# Patient Record
Sex: Female | Born: 1995 | Race: White | Hispanic: No | Marital: Single | Smoking: Never smoker
Health system: Southern US, Community
[De-identification: ages and names within clinical notes are randomized; demographics above are authoritative.]

## PROBLEM LIST (undated history)

## (undated) DIAGNOSIS — E282 Polycystic ovarian syndrome: Secondary | ICD-10-CM

## (undated) HISTORY — PX: MOUTH SURGERY: SHX715

## (undated) HISTORY — DX: Polycystic ovarian syndrome: E28.2

---
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
## 2013-05-03 ENCOUNTER — Encounter: Payer: Self-pay | Admitting: Internal Medicine

## 2013-05-03 ENCOUNTER — Ambulatory Visit (INDEPENDENT_AMBULATORY_CARE_PROVIDER_SITE_OTHER): Payer: 59 | Admitting: Internal Medicine

## 2013-05-03 VITALS — BP 120/68 | Temp 98.8°F | Ht 70.25 in | Wt 278.0 lb

## 2013-05-03 DIAGNOSIS — L709 Acne, unspecified: Secondary | ICD-10-CM | POA: Insufficient documentation

## 2013-05-03 DIAGNOSIS — E669 Obesity, unspecified: Secondary | ICD-10-CM

## 2013-05-03 DIAGNOSIS — L708 Other acne: Secondary | ICD-10-CM

## 2013-05-03 DIAGNOSIS — E282 Polycystic ovarian syndrome: Secondary | ICD-10-CM

## 2013-05-03 LAB — CBC WITH DIFFERENTIAL/PLATELET
Basophils Relative: 0 % (ref 0–1)
HCT: 36.9 % (ref 36.0–46.0)
Hemoglobin: 12.1 g/dL (ref 12.0–15.0)
Lymphocytes Relative: 27 % (ref 12–46)
Lymphs Abs: 1.8 10*3/uL (ref 0.7–4.0)
MCHC: 32.8 g/dL (ref 30.0–36.0)
Monocytes Absolute: 0.4 10*3/uL (ref 0.1–1.0)
Monocytes Relative: 6 % (ref 3–12)
Neutro Abs: 4.3 10*3/uL (ref 1.7–7.7)
Neutrophils Relative %: 64 % (ref 43–77)
RBC: 4.58 MIL/uL (ref 3.87–5.11)

## 2013-05-03 LAB — HEMOGLOBIN A1C
Hgb A1c MFr Bld: 5.4 % (ref <5.7)
Mean Plasma Glucose: 108 mg/dL (ref <117)

## 2013-05-03 LAB — CHOLESTEROL, TOTAL: Cholesterol: 151 mg/dL (ref 0–169)

## 2013-05-03 LAB — TSH: TSH: 1.264 u[IU]/mL (ref 0.350–4.500)

## 2013-05-03 NOTE — Progress Notes (Signed)
  Subjective:    Patient ID: Bethany Boyd, female    DOB: 01-04-1996, 18 y.o.   MRN: [id number]  HPI  17 year old White female will be attending [employer] and taking engineering courses at [employer] this fall. 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 She's here for college entrance exam. Has appointment see Dr. Shawnie Pons in a couple of weeks regarding polycystic ovary syndrome. Patient has irregular menses, acne, hirsutism. She is overweight. Says she gets some exercise. She's been working this summer with her father but not really watching her diet. Both parents are overweight. Sister is overweight.  No complaints or problems today.  Did have Gardasil series through Dr. Nicholas Lose, GYN.  Not sexually active. Does not smoke,consume alcohol, or use illicit drugs  Review of immunization records indicates only one MMR immunization. Has never had meningitis vaccine.  Family history: Father with hypertension. Mother with history of diabetes mellitus and asthma  Social history: Parents are married. Both she and her sister are very good students. No other siblings.     Review of Systems  Constitutional: Negative.   Endocrine:       Hirsutism  Genitourinary:       Irregular menses  Skin:       Acne  All other systems reviewed and are negative.       Objective:   Physical Exam  Vitals reviewed. Constitutional: She is oriented to person, place, and time. She appears well-developed and well-nourished. No distress.  Obese  HENT:  Head: Normocephalic and atraumatic.  Right Ear: External ear normal.  Left Ear: External ear normal.  Mouth/Throat: Oropharynx is clear and moist. No oropharyngeal exudate.  Eyes: Conjunctivae and EOM are normal. Pupils are equal, round, and reactive to light. Right eye exhibits no discharge. Left eye exhibits no discharge. No scleral icterus.  Neck: Neck supple. No JVD present. No thyromegaly present.  Cardiovascular: Normal rate,  regular rhythm and normal heart sounds.   No murmur heard. Pulmonary/Chest: Effort normal and breath sounds normal. No respiratory distress. She has no wheezes. She has no rales. She exhibits no tenderness.  Breasts normal female without masses  Abdominal: Soft. Bowel sounds are normal. She exhibits no distension and no mass. There is no tenderness. There is no rebound and no guarding.  Genitourinary:  deferred  Lymphadenopathy:    She has no cervical adenopathy.  Neurological: She is alert and oriented to person, place, and time. She has normal reflexes. She displays normal reflexes. No cranial nerve deficit. Coordination normal.  Skin: Skin is warm and dry. She is not diaphoretic.  Acne on face and upper back  Hisutism face, neck, round nipples and abdomen  Psychiatric: She has a normal mood and affect. Her behavior is normal. Judgment and thought content normal.          Assessment & Plan:  History of polycystic ovary syndrome diagnosed by Dr. Nicholas Lose. Patient has irregular menses, acne, hirsutism, obesity  Plan: Check TSH and hemoglobin A1c. Patient is to see Dr. Shawnie Pons in a couple of weeks regarding irregular menses and polycystic ovary syndrome. Spoke with patient about diet and exercise particularly as she is going off to college. We will order MMR and meningitis vaccine. There is a form be completed for college entrance.

## 2013-05-06 NOTE — Patient Instructions (Addendum)
Watch diet and exercise. Followup with GYN physician. Return in one year. Meningitis vaccine has been ordered for college entrance.

## 2013-05-15 ENCOUNTER — Encounter: Payer: 59 | Admitting: Family Medicine

## 2013-05-15 ENCOUNTER — Encounter: Payer: Self-pay | Admitting: Family Medicine

## 2013-05-15 ENCOUNTER — Ambulatory Visit (INDEPENDENT_AMBULATORY_CARE_PROVIDER_SITE_OTHER): Payer: 59 | Admitting: Family Medicine

## 2013-05-15 VITALS — BP 127/84 | HR 85 | Temp 97.0°F | Ht 71.0 in | Wt 293.0 lb

## 2013-05-15 DIAGNOSIS — E282 Polycystic ovarian syndrome: Secondary | ICD-10-CM

## 2013-05-15 MED ORDER — DROSPIREN-ETH ESTRAD-LEVOMEFOL 3-0.03-0.451 MG PO TABS: 1.0000 | ORAL_TABLET | Freq: Every day | ORAL | Status: DC

## 2013-05-15 NOTE — Patient Instructions (Signed)
Polycystic Ovarian Syndrome  Polycystic ovarian syndrome is a condition with a number of problems. One problem is with the ovaries. The ovaries are organs located in the female pelvis, on each side of the uterus. Usually, during the menstrual cycle, an egg is released from 1 ovary every month. This is called ovulation. When the egg is fertilized, it goes into the womb (uterus), which allows for the growth of a baby. The egg travels from the ovary through the fallopian tube to the uterus. The ovaries also make the hormones estrogen and progesterone. These hormones help the development of a woman's breasts, body shape, and body hair. They also regulate the menstrual cycle and pregnancy.  Sometimes, cysts form in the ovaries. A cyst is a fluid-filled sac. On the ovary, different types of cysts can form. The most common type of ovarian cyst is called a functional or ovulation cyst. It is normal, and often forms during the normal menstrual cycle. Each month, a woman's ovaries grow tiny cysts that hold the eggs. When an egg is fully grown, the sac breaks open. This releases the egg. Then, the sac which released the egg from the ovary dissolves. In one type of functional cyst, called a follicle cyst, the sac does not break open to release the egg. It may actually continue to grow. This type of cyst usually disappears within 1 to 3 months.   One type of cyst problem with the ovaries is called Polycystic Ovarian Syndrome (PCOS). In this condition, many follicle cysts form, but do not rupture and produce an egg. This health problem can affect the following:  · Menstrual cycle.  · Heart.  · Obesity.  · Cancer of the uterus.  · Fertility.  · Blood vessels.  · Hair growth (face and body) or baldness.  · Hormones.  · Appearance.  · High blood pressure.  · Stroke.  · Insulin production.  · Inflammation of the liver.  · Elevated blood cholesterol and triglycerides.  CAUSES   · No one knows the exact cause of PCOS.  · Women with  PCOS often have a mother or sister with PCOS. There is not yet enough proof to say this is inherited.  · Many women with PCOS have a weight problem.  · Researchers are looking at the relationship between PCOS and the body's ability to make insulin. Insulin is a hormone that regulates the change of sugar, starches, and other food into energy for the body's use, or for storage. Some women with PCOS make too much insulin. It is possible that the ovaries react by making too many female hormones, called androgens. This can lead to acne, excessive hair growth, weight gain, and ovulation problems.  · Too much production of luteinizing hormone (LH) from the pituitary gland in the brain stimulates the ovary to produce too much female hormone (androgen).  SYMPTOMS   · Infrequent or no menstrual periods, and/or irregular bleeding.  · Inability to get pregnant (infertility), because of not ovulating.  · Increased growth of hair on the face, chest, stomach, back, thumbs, thighs, or toes.  · Acne, oily skin, or dandruff.  · Pelvic pain.  · Weight gain or obesity, usually carrying extra weight around the waist.  · Type 2 diabetes (this is the diabetes that usually does not need insulin).  · High cholesterol.  · High blood pressure.  · Female-pattern baldness or thinning hair.  · Patches of thickened and dark brown or black skin on the neck, arms, breasts,   or thighs.  · Skin tags, or tiny excess flaps of skin, in the armpits or neck area.  · Sleep apnea (excessive snoring and breathing stops at times while asleep).  · Deepening of the voice.  · Gestational diabetes when pregnant.  · Increased risk of miscarriage with pregnancy.  DIAGNOSIS   There is no single test to diagnose PCOS.   · Your caregiver will:  · Take a medical history.  · Perform a pelvic exam.  · Perform an ultrasound.  · Check your female and female hormone levels.  · Measure glucose or sugar levels in the blood.  · Do other blood tests.  · If you are producing too many  female hormones, your caregiver will make sure it is from PCOS. At the physical exam, your caregiver will want to evaluate the areas of increased hair growth. Try to allow natural hair growth for a few days before the visit.  · During a pelvic exam, the ovaries may be enlarged or swollen by the increased number of small cysts. This can be seen more easily by vaginal ultrasound or screening, to examine the ovaries and lining of the uterus (endometrium) for cysts. The uterine lining may become thicker, if there has not been a regular period.  TREATMENT   Because there is no cure for PCOS, it needs to be managed to prevent problems. Treatments are based on your symptoms. Treatment is also based on whether you want to have a baby or whether you need contraception.   Treatment may include:  · Progesterone hormone, to start a menstrual period.  · Birth control pills, to make you have regular menstrual periods.  · Medicines to make you ovulate, if you want to get pregnant.  · Medicines to control your insulin.  · Medicine to control your blood pressure.  · Medicine and diet, to control your high cholesterol and triglycerides in your blood.  · Surgery, making small holes in the ovary, to decrease the amount of female hormone production. This is done through a long, lighted tube (laparoscope), placed into the pelvis through a tiny incision in the lower abdomen.  Your caregiver will go over some of the choices with you.  WOMEN WITH PCOS HAVE THESE CHARACTERISTICS:  · High levels of female hormones called androgens.  · An irregular or no menstrual cycle.  · May have many small cysts in their ovaries.  PCOS is the most common hormonal reproductive problem in women of childbearing age.  WHY DO WOMEN WITH PCOS HAVE TROUBLE WITH THEIR MENSTRUAL CYCLE?  Each month, about 20 eggs start to mature in the ovaries. As one egg grows and matures, the follicle breaks open to release the egg, so it can travel through the fallopian tube for  fertilization. When the single egg leaves the follicle, ovulation takes place. In women with PCOS, the ovary does not make all of the hormones it needs for any of the eggs to fully mature. They may start to grow and accumulate fluid, but no one egg becomes large enough. Instead, some may remain as cysts. Since no egg matures or is released, ovulation does not occur and the hormone progesterone is not made. Without progesterone, a woman's menstrual cycle is irregular or absent. Also, the cysts produce female hormones, which continue to prevent ovulation.   Document Released: 02/17/2005 Document Revised: 01/16/2012 Document Reviewed: 09/11/2009  ExitCare® Patient Information ©2014 [employer].

## 2013-05-16 ENCOUNTER — Ambulatory Visit (INDEPENDENT_AMBULATORY_CARE_PROVIDER_SITE_OTHER): Payer: 59 | Admitting: Internal Medicine

## 2013-05-16 DIAGNOSIS — Z23 Encounter for immunization: Secondary | ICD-10-CM

## 2013-05-16 MED ORDER — MENINGOCOCCAL VAC A,C,Y,W-135 ~~LOC~~ INJ: 0.5000 mL | INJECTION | Freq: Once | SUBCUTANEOUS | Status: DC

## 2013-05-16 MED ORDER — VALACYCLOVIR HCL 500 MG PO TABS: 500.0000 mg | ORAL_TABLET | Freq: Two times a day (BID) | ORAL | Status: AC | PRN

## 2013-05-16 NOTE — Addendum Note (Signed)
Addended by: Judy Pimple on: 05/16/2013 03:31 PM   Modules accepted: Orders

## 2013-05-16 NOTE — Progress Notes (Signed)
  Subjective:    Patient ID: Bethany Boyd, female    DOB: 06/03/1996, 17 y.o.   MRN: [id number]  Gynecologic Exam Pertinent negatives include no back pain, chills, constipation, diarrhea, fever, headaches, nausea, rash or vomiting.    Pt is a new pt. Today.  Previously a pt. With Dr. Nicholas Lose.  Has fasting hyperinsulinemia, mildly elevated total testosterone and diagnosis of PCOS.  She has oligomenorrhea with cycles q 3-6 months.  Previously on extended dosing OC's but had breakthrough bleeding on this.  Has significant hirsutism.  Better with Drospirinone. Nml recent TSH, Chol, and Hgb A1C.  Has been on Metformin in the past but had stomach upset while on this.  Past Medical History  Diagnosis Date  . PCOS (polycystic ovarian syndrome)    Past Surgical History  Procedure Laterality Date  . Mouth surgery      wisdom teeth   Current Outpatient Prescriptions on File Prior to Visit  Medication Sig Dispense Refill  . ibuprofen (ADVIL,MOTRIN) 200 MG tablet Take 200 mg by mouth every 6 (six) hours as needed.       No current facility-administered medications on file prior to visit.   Allergies  Allergen Reactions  . Lorabid (Loracarbef) Hives   Family History  Problem Relation Age of Onset  . Asthma Mother   . Diabetes Mother   . Hypertension Father   . Asthma Sister   . Stroke Paternal Grandfather 36  . Breast cancer Paternal Grandmother 67  . Diabetes Maternal Grandmother   . Diabetes Maternal Grandfather    History   Social History  . Marital Status: Single    Spouse Name: N/A    Number of Children: N/A  . Years of Education: N/A   Occupational History  . Not on file.   Social History Main Topics  . Smoking status: Never Smoker   . Smokeless tobacco: Never Used  . Alcohol Use: No  . Drug Use: No  . Sexually Active: No   Other Topics Concern  . Not on file   Social History Narrative  . No narrative on file   Starting college in [place] and [place] in the  fall  Review of Systems  Constitutional: Negative for fever and chills.  HENT: Negative for hearing loss.   Eyes: Negative for visual disturbance.  Respiratory: Negative for chest tightness and shortness of breath.   Cardiovascular: Negative for chest pain.  Gastrointestinal: Negative for nausea, vomiting, diarrhea, constipation and abdominal distention.  Genitourinary: Positive for menstrual problem (amenorrhea since 11/2012). Negative for dyspareunia.  Musculoskeletal: Negative for back pain.  Skin: Negative for rash.  Neurological: Negative for seizures and headaches.  Psychiatric/Behavioral: Negative for behavioral problems.       Objective:   Physical Exam  Vitals reviewed. Constitutional: She is oriented to person, place, and time. She appears well-developed and well-nourished. No distress.  HENT:  Head: Normocephalic and atraumatic.  Eyes: No scleral icterus.  Neck: Neck supple.  Cardiovascular: Normal rate and regular rhythm.   Pulmonary/Chest: Effort normal.  Abdominal: Soft.  Genitourinary:  Not sexually active, so does not need pelvic exam  Musculoskeletal: Normal range of motion.  Neurological: She is alert and oriented to person, place, and time.  Skin: Skin is warm and dry. No rash noted.  Psychiatric: She has a normal mood and affect.  Tearful about hair growth          Assessment & Plan:

## 2013-05-16 NOTE — Assessment & Plan Note (Signed)
Implications of PCOS discussed.  Do not think she has to have Metformin.  Needs regular cycles.  If not seeking pregnancy, would use OC's.  She has breakthrough bleeding on extended cycling, so will just take normally. Would try Clomid when and if needs to conceive.  First pap and pelvic at 19.

## 2013-05-30 ENCOUNTER — Encounter: Payer: Self-pay | Admitting: *Deleted

## 2014-07-30 ENCOUNTER — Encounter: Payer: Self-pay | Admitting: Internal Medicine

## 2015-06-01 ENCOUNTER — Encounter: Payer: Self-pay | Admitting: Emergency Medicine

## 2015-06-01 ENCOUNTER — Emergency Department
Admission: EM | Admit: 2015-06-01 | Discharge: 2015-06-01 | Disposition: A | Discharge status: Home / Self Care | Payer: 59 | Source: Home / Self Care | Attending: Family Medicine | Admitting: Family Medicine

## 2015-06-01 DIAGNOSIS — N3001 Acute cystitis with hematuria: Secondary | ICD-10-CM | POA: Diagnosis not present

## 2015-06-01 LAB — POCT URINALYSIS DIP (MANUAL ENTRY)
Bilirubin, UA: NEGATIVE
Glucose, UA: NEGATIVE
Ketones, POC UA: NEGATIVE
Nitrite, UA: POSITIVE — AB
Protein Ur, POC: >=300 — AB
Spec Grav, UA: 1.025 (ref 1.005–1.03)
Urobilinogen, UA: 0.2 (ref 0–1)
pH, UA: 7 (ref 5–8)

## 2015-06-01 MED ORDER — PHENAZOPYRIDINE HCL 200 MG PO TABS: 200.0000 mg | ORAL_TABLET | Freq: Three times a day (TID) | ORAL | Status: DC

## 2015-06-01 MED ORDER — SULFAMETHOXAZOLE-TRIMETHOPRIM 800-160 MG PO TABS: 1.0000 | ORAL_TABLET | Freq: Two times a day (BID) | ORAL | Status: AC

## 2015-06-01 NOTE — ED Notes (Signed)
Pt c/o urinary frequency, urgency, pain and hematuria that started in the night.

## 2015-06-01 NOTE — ED Provider Notes (Signed)
CSN: [id number]     Arrival date & time 06/01/15  8657 History   First MD Initiated Contact with Patient 06/01/15 [phone]     Chief Complaint  Patient presents with  . Urinary Tract Infection   (Consider location/radiation/quality/duration/timing/severity/associated sxs/prior Treatment) Patient is a 19 y.o. female presenting with urinary tract infection.  Urinary Tract Infection Pain quality:  Burning and aching (Pressure) Pain severity:  Moderate Onset quality:  Sudden Timing:  Intermittent Progression:  Waxing and waning Chronicity:  New Recent urinary tract infections: no   Relieved by:  None tried Worsened by:  Nothing tried Ineffective treatments:  None tried Urinary symptoms: discolored urine, frequent urination and hematuria   Associated symptoms: no abdominal pain, no fever, no flank pain, no nausea, no vaginal discharge and no vomiting   Risk factors: no hx of pyelonephritis, no hx of urolithiasis, not pregnant and no renal disease     Patient is a 19 year old female presenting to urgent care with complaint of sudden onset urinary frequency, urgency, pain with urination and hematuria that started last night.  Symptoms are moderate in severity, patient states she did not want to wait any longer to be evaluated for symptoms.  She has not tried anything for symptoms, as they came on suddenly.  Denies prior history of urinary infections.  Denies fevers, chills, nausea, vomiting, diarrhea.  Denies back pain or abdominal pain.  Denies vaginal symptoms.  Denies history of kidney stones or renal disease.  Past Medical History  Diagnosis Date  . PCOS (polycystic ovarian syndrome)    Past Surgical History  Procedure Laterality Date  . Mouth surgery      wisdom teeth   Family History  Problem Relation Age of Onset  . Asthma Mother   . Diabetes Mother   . Hypertension Father   . Asthma Sister   . Stroke Paternal Grandfather 58  . Breast cancer Paternal Grandmother 74  . Diabetes  Maternal Grandmother   . Diabetes Maternal Grandfather    History  Substance Use Topics  . Smoking status: Never Smoker   . Smokeless tobacco: Never Used  . Alcohol Use: No   OB History    Gravida Para Term Preterm AB TAB SAB Ectopic Multiple Living   0              Review of Systems  Constitutional: Negative for fever and chills.  Respiratory: Negative for cough and shortness of breath.   Cardiovascular: Negative for chest pain and palpitations.  Gastrointestinal: Negative for nausea, vomiting, abdominal pain and diarrhea.  Genitourinary: Positive for dysuria, urgency, frequency and hematuria. Negative for flank pain, decreased urine volume, vaginal bleeding, vaginal discharge, vaginal pain and pelvic pain.  Musculoskeletal: Negative for myalgias and back pain.  All other systems reviewed and are negative.   Allergies  Lorabid  Home Medications   Prior to Admission medications   Medication Sig Start Date End Date Taking? Authorizing Provider  Drospirenone-Ethinyl Estradiol-Levomefol (SAFYRAL) 3-0.03-0.451 MG tablet Take 1 tablet by mouth daily. 05/15/13   Reva Bores, MD  ibuprofen (ADVIL,MOTRIN) 200 MG tablet Take 200 mg by mouth every 6 (six) hours as needed.    Historical Provider, MD  phenazopyridine (PYRIDIUM) 200 MG tablet Take 1 tablet (200 mg total) by mouth 3 (three) times daily. 06/01/15   Junius Finner, PA-C  sulfamethoxazole-trimethoprim (BACTRIM DS,SEPTRA DS) 800-160 MG per tablet Take 1 tablet by mouth 2 (two) times daily. For 3 days 06/01/15 06/08/15  Junius Finner, PA-C  valACYclovir (  VALTREX) 500 MG tablet Take 1 tablet (500 mg total) by mouth 2 (two) times daily as needed. 05/16/13   Margaree Mackintosh, MD   BP 132/85 mmHg  Pulse 93  Temp(Src) 98.5 F (36.9 C) (Oral)  SpO2 99%  LMP 05/12/2015 (Exact Date) Physical Exam  Constitutional: She appears well-developed and well-nourished. No distress.  HENT:  Head: Normocephalic and atraumatic.  Eyes: Conjunctivae are  normal. No scleral icterus.  Neck: Normal range of motion.  Cardiovascular: Normal rate, regular rhythm and normal heart sounds.   Pulmonary/Chest: Effort normal and breath sounds normal. No respiratory distress. She has no wheezes. She has no rales. She exhibits no tenderness.  Abdominal: Soft. Bowel sounds are normal. She exhibits no distension and no mass. There is no tenderness. There is no rebound, no guarding and no CVA tenderness.  Obese abdomen, soft, non-tender.  Musculoskeletal: Normal range of motion.  Neurological: She is alert.  Skin: Skin is warm and dry. She is not diaphoretic.  Nursing note and vitals reviewed.   ED Course  Procedures (including critical care time) Labs Review Labs Reviewed  POCT URINALYSIS DIP (MANUAL ENTRY) - Abnormal; Notable for the following:    Color, UA brown (*)    Clarity, UA cloudy (*)    Blood, UA large (*)    Protein Ur, POC >=300 (*)    Nitrite, UA Positive (*)    Leukocytes, UA Trace (*)    All other components within normal limits  URINE CULTURE    Imaging Review No results found.   MDM   1. Acute cystitis with hematuria     Patient is a 19 year old female presenting to urgent care with sudden onset urinary symptoms.  Symptoms consistent with UTI.  No history of prior UTIs.  No history of renal disease or kidney stones.  Patient appears well nontoxic.  No CVA tenderness.  UA consistent with UTI, due to first infection will send urine culture.  Patient is allergic to Lorabid which is similar to second-generation cephalosporins.  Will place on Bactrim instead.  Advised to follow-up with PCP in 4-5 days if not improving.  Home care instructions provided including encouraged plenty of fluids including water, cranberry juice; advised to avoid sodas. Discussed symptoms that warrant emergent care in the ED. Return precautions provided. Pt verbalized understanding and agreement with tx plan.     Junius Finner, PA-C 06/01/15 [phone]

## 2015-06-01 NOTE — Discharge Instructions (Signed)
Urinary Tract Infection °Urinary tract infections (UTIs) can develop anywhere along your urinary tract. Your urinary tract is your body's drainage system for removing wastes and extra water. Your urinary tract includes two kidneys, two ureters, a bladder, and a urethra. Your kidneys are a pair of bean-shaped organs. Each kidney is about the size of your fist. They are located below your ribs, one on each side of your spine. °CAUSES °Infections are caused by microbes, which are microscopic organisms, including fungi, viruses, and bacteria. These organisms are so small that they can only be seen through a microscope. Bacteria are the microbes that most commonly cause UTIs. °SYMPTOMS  °Symptoms of UTIs may vary by age and gender of the patient and by the location of the infection. Symptoms in young women typically include a frequent and intense urge to urinate and a painful, burning feeling in the bladder or urethra during urination. Older women and men are more likely to be tired, shaky, and weak and have muscle aches and abdominal pain. A fever may mean the infection is in your kidneys. Other symptoms of a kidney infection include pain in your back or sides below the ribs, nausea, and vomiting. °DIAGNOSIS °To diagnose a UTI, your caregiver will ask you about your symptoms. Your caregiver also will ask to provide a urine sample. The urine sample will be tested for bacteria and white blood cells. White blood cells are made by your body to help fight infection. °TREATMENT  °Typically, UTIs can be treated with medication. Because most UTIs are caused by a bacterial infection, they usually can be treated with the use of antibiotics. The choice of antibiotic and length of treatment depend on your symptoms and the type of bacteria causing your infection. °HOME CARE INSTRUCTIONS °1. If you were prescribed antibiotics, take them exactly as your caregiver instructs you. Finish the medication even if you feel better after you  have only taken some of the medication. °2. Drink enough water and fluids to keep your urine clear or pale yellow. °3. Avoid caffeine, tea, and carbonated beverages. They tend to irritate your bladder. °4. Empty your bladder often. Avoid holding urine for long periods of time. °5. Empty your bladder before and after sexual intercourse. °6. After a bowel movement, women should cleanse from front to back. Use each tissue only once. °SEEK MEDICAL CARE IF:  °· You have back pain. °· You develop a fever. °· Your symptoms do not begin to resolve within 3 days. °SEEK IMMEDIATE MEDICAL CARE IF:  °· You have severe back pain or lower abdominal pain. °· You develop chills. °· You have nausea or vomiting. °· You have continued burning or discomfort with urination. °MAKE SURE YOU:  °· Understand these instructions. °· Will watch your condition. °· Will get help right away if you are not doing well or get worse. °Document Released: 08/03/2005 Document Revised: 04/24/2012 Document Reviewed: 12/02/2011 °ExitCare® Patient Information ©2015 [employer]. This information is not intended to replace advice given to you by your health care provider. Make sure you discuss any questions you have with your health care provider. ° °Urine Culture Collection, Female ° You will collect a sample of pee (urine) in a cup. Read the instructions below before beginning. If you have any questions, ask the nurse before you begin. Follow the instructions carefully. °7. Wash your hands with soap and water and dry them thoroughly. °8. Open the lid of the cup. Be careful not to touch the inside. °9. Clean the   private (genital) area. °· Sit over the toilet. Use the fingers of one hand to separate and hold open the folds of the skin in your private area. °· Clean the pee (urinary) opening and surrounding area with the gauze, wiping from front to back. Throw away the gauze in the trash, not the toilet. Repeat this step two times. °10. With the folds of  skin still separated, pee a small amount into toilet. STOP, then pee into the cup. Fill the cup half way. °11. Put the lid on the cup tightly. °12. Wash your hands with soap and water. °13. If you were given a label, put the label on the cup. °14. Give the cup to the nurse. °Document Released: 10/06/2008 Document Revised: 10/10/2012 Document Reviewed: 10/06/2008 °ExitCare® Patient Information ©2015 [employer]. This information is not intended to replace advice given to you by your health care provider. Make sure you discuss any questions you have with your health care provider. ° °

## 2015-06-03 LAB — URINE CULTURE: Colony Count: >=100000

## 2015-06-05 ENCOUNTER — Telehealth: Payer: Self-pay | Admitting: Emergency Medicine

## 2015-06-05 NOTE — Telephone Encounter (Signed)
-----   Message from Junius Finner, [place] sent at 06/05/2015  8:52 AM EDT ----- Please call to make sure Bethany Boyd is feeling better, if not, she may need additional antibiotics.

## 2015-11-10 MED FILL — SAFYRAL TABLET: 3-0.03-0.45 | 28 days supply | Qty: 28 | Fill #8

## 2015-11-12 ENCOUNTER — Ambulatory Visit (INDEPENDENT_AMBULATORY_CARE_PROVIDER_SITE_OTHER): Payer: 59 | Admitting: Internal Medicine

## 2015-11-12 VITALS — BP 126/84

## 2015-11-12 DIAGNOSIS — Z23 Encounter for immunization: Secondary | ICD-10-CM | POA: Diagnosis not present

## 2015-12-04 MED FILL — SAFYRAL TABLET: 3-0.03-0.45 | 28 days supply | Qty: 28 | Fill #9

## 2015-12-31 MED FILL — SAFYRAL TABLET: 3-0.03-0.45 | 28 days supply | Qty: 28 | Fill #10

## 2016-01-19 ENCOUNTER — Emergency Department (INDEPENDENT_AMBULATORY_CARE_PROVIDER_SITE_OTHER): Payer: 59

## 2016-01-19 ENCOUNTER — Emergency Department
Admission: EM | Admit: 2016-01-19 | Discharge: 2016-01-19 | Disposition: A | Discharge status: Home / Self Care | Payer: 59 | Source: Home / Self Care | Attending: Emergency Medicine | Admitting: Emergency Medicine

## 2016-01-19 ENCOUNTER — Encounter: Payer: Self-pay | Admitting: *Deleted

## 2016-01-19 DIAGNOSIS — M25511 Pain in right shoulder: Secondary | ICD-10-CM

## 2016-01-19 DIAGNOSIS — S46911A Strain of unspecified muscle, fascia and tendon at shoulder and upper arm level, right arm, initial encounter: Secondary | ICD-10-CM | POA: Diagnosis not present

## 2016-01-19 MED ORDER — MELOXICAM 7.5 MG PO TABS: ORAL_TABLET | ORAL | Status: DC

## 2016-01-19 MED ORDER — CYCLOBENZAPRINE HCL 10 MG PO TABS: 10.0000 mg | ORAL_TABLET | Freq: Every day | ORAL | Status: DC

## 2016-01-19 MED FILL — MELOXICAM 7.5 MG TABLET: 7.5 | 10 days supply | Qty: 20 | Fill #0

## 2016-01-19 MED FILL — CYCLOBENZAPRINE 10 MG TAB: 10 | 10 days supply | Qty: 10 | Fill #0

## 2016-01-19 NOTE — ED Provider Notes (Signed)
CSN: [id number]     Arrival date & time 01/19/16  1105 History   First MD Initiated Contact with Patient 01/19/16 1124     Chief Complaint  Patient presents with  . Shoulder Pain    Patient is a 20 y.o. female presenting with shoulder pain. The history is provided by the patient.  Shoulder Pain Location:  Shoulder Upper extremity injury: Recalls no specific injury, but she awoke 3 days ago with pain and stiffness right shoulder diffusely, with occasional radiation of numbness into the elbow. Currently denies radiation or numbness or focal weakness.   Shoulder location:  R shoulder Pain details:    Quality:  Dull (Dull, tight," like a muscle cramp")   Severity:  Severe (8 out of 10)   Onset quality:  Unable to specify   Timing:  Constant   Progression:  Unchanged Chronicity:  New (However, she states she dislocated right shoulder around age 41, but that resolved without sequelae) Relieved by:  Rest Worsened by:  Movement Ineffective treatments:  Acetaminophen (And OTC naproxen) Associated symptoms: decreased range of motion, neck pain (Right lateral posterior neck pain, denies any midline or C-spine area pain) and stiffness   Associated symptoms: no back pain, no fever and no swelling   Risk factors: no known bone disorder and no recent illness     Past Medical History  Diagnosis Date  . PCOS (polycystic ovarian syndrome)    Past Surgical History  Procedure Laterality Date  . Mouth surgery      wisdom teeth   Family History  Problem Relation Age of Onset  . Asthma Mother   . Diabetes Mother   . Hypertension Father   . Asthma Sister   . Stroke Paternal Grandfather 44  . Breast cancer Paternal Grandmother 53  . Diabetes Maternal Grandmother   . Diabetes Maternal Grandfather    Social History  Substance Use Topics  . Smoking status: Never Smoker   . Smokeless tobacco: Never Used  . Alcohol Use: No   OB History    Gravida Para Term Preterm AB TAB SAB Ectopic Multiple  Living   0              Review of Systems  Constitutional: Negative for fever.  Musculoskeletal: Positive for stiffness and neck pain (Right lateral posterior neck pain, denies any midline or C-spine area pain). Negative for back pain.  All other systems reviewed and are negative.  Currently on her menstrual period. She denies chance of pregnancy Allergies  Lorabid  Home Medications   Prior to Admission medications   Medication Sig Start Date End Date Taking? Authorizing Provider  cyclobenzaprine (FLEXERIL) 10 MG tablet Take 1 tablet (10 mg total) by mouth at bedtime. For muscle relaxant. Caution: May cause drowsiness 01/19/16   Lajean Manes, MD  Drospirenone-Ethinyl Estradiol-Levomefol [facility]) 3-0.03-0.451 MG tablet Take 1 tablet by mouth daily. 05/15/13   Reva Bores, MD  ibuprofen (ADVIL,MOTRIN) 200 MG tablet Take 200 mg by mouth every 6 (six) hours as needed.    Historical Provider, MD  meloxicam (MOBIC) 7.5 MG tablet Take 1 twice a day as needed for pain. Take with food. (Do not take with any other NSAID.) 01/19/16   Lajean Manes, MD  valACYclovir (VALTREX) 500 MG tablet Take 1 tablet (500 mg total) by mouth 2 (two) times daily as needed. 05/16/13   Margaree Mackintosh, MD   Meds Ordered and Administered this Visit  Medications - No data to display  BP  143/92 mmHg  Pulse 75  Temp(Src) 97.6 F (36.4 C) (Oral)  Resp 18  Ht 5\' 11"  (1.803 m)  Wt 312 lb (141.522 kg)  BMI 43.53 kg/m2  SpO2 99%  LMP 01/19/2016 No data found.   Physical Exam  Constitutional: She is oriented to person, place, and time. She appears well-developed and well-nourished. No distress.  Uncomfortable from right shoulder pain. She avoids moving right shoulder because of pain  HENT:  Head: Normocephalic and atraumatic.  Eyes: Conjunctivae and EOM are normal. Pupils are equal, round, and reactive to light. No scleral icterus.  Neck: Normal range of motion.  Cardiovascular: Normal rate.   Pulmonary/Chest:  Effort normal.  Abdominal: She exhibits no distension.  Musculoskeletal:       Right shoulder: She exhibits decreased range of motion, tenderness, bony tenderness, pain and spasm (R post cerv mms). She exhibits no swelling and normal strength.       Right elbow: Normal.She exhibits normal range of motion.       Back:       Right hand: She exhibits normal capillary refill and no swelling. Normal sensation noted. Normal strength noted.  Neurological: She is alert and oriented to person, place, and time. She has normal strength and normal reflexes. No cranial nerve deficit or sensory deficit.  Skin: Skin is warm. No rash noted.  Psychiatric: She has a normal mood and affect.  Nursing note and vitals reviewed.    UC Course Discussed with patient, she agrees with ordering x-ray right shoulder 11:35 AM x-ray right shoulder ordered   Procedures (including critical care time)  Labs Review Labs Reviewed - No data to display  Imaging Review Dg Shoulder Right  01/19/2016  CLINICAL DATA:  Three day history of right shoulder pain and decreased range of motion. EXAM: RIGHT SHOULDER - 2+ VIEW COMPARISON:  None. FINDINGS: The joint spaces are maintained. No acute bony findings or bone lesion. No abnormal soft tissue calcifications. Slight elevation of the distal clavicle in relation to the acromion could be due to a [facility]. The visualized lung is clear and the visualized ribs are intact. IMPRESSION: No acute bony findings.  Suspect prior [facility] joint injury. Electronically Signed   By: Rudie MeyerP.  Gallerani M.D.   On: 01/19/2016 11:58     X-ray shows no evidence of any acute bony findings . There is evidence of a possible prior AC joint injury, but no evidence of any acute findings.  MDM   1. Right shoulder strain, initial encounter    Treatment options discussed, as well as risks, benefits, alternatives. Patient voiced understanding and agreement with the following plans: Right  shoulder sling applied. Rest, ice, then heat. New Prescriptions   CYCLOBENZAPRINE (FLEXERIL) 10 MG TABLET    Take 1 tablet (10 mg total) by mouth at bedtime. For muscle relaxant. Caution: May cause drowsiness   MELOXICAM (MOBIC) 7.5 MG TABLET    Take 1 twice a day as needed for pain. Take with food. (Do not take with any other NSAID.)   Follow-up with your Orthopedist in 5-7 days if not improving, or sooner if symptoms become worse. (Her orthopedist is Dr. Margreta Journeyahldorf and Dr. Federico FlakeMcKinnley's group Precautions discussed. Red flags discussed. Questions invited and answered. Patient voiced understanding and agreement.     Lajean Manesavid Massey, MD 01/19/16 [id number]

## 2016-01-19 NOTE — ED Notes (Signed)
Pt c/o RT shoulder pain with RT hand numbness x 3 days. Denies injury. Pain radiates to her neck and down her arm. She has taken Aleve and Tylenol without relief.

## 2016-01-25 ENCOUNTER — Ambulatory Visit (INDEPENDENT_AMBULATORY_CARE_PROVIDER_SITE_OTHER): Payer: 59 | Admitting: Internal Medicine

## 2016-01-25 ENCOUNTER — Encounter: Payer: Self-pay | Admitting: Internal Medicine

## 2016-01-25 VITALS — BP 128/88 | HR 85 | Temp 98.2°F | Resp 20 | Ht 71.0 in | Wt 294.0 lb

## 2016-01-25 DIAGNOSIS — H6503 Acute serous otitis media, bilateral: Secondary | ICD-10-CM | POA: Diagnosis not present

## 2016-01-25 DIAGNOSIS — J029 Acute pharyngitis, unspecified: Secondary | ICD-10-CM

## 2016-01-25 LAB — POCT RAPID STREP A (OFFICE): RAPID STREP A SCREEN: NEGATIVE

## 2016-01-25 NOTE — Progress Notes (Signed)
   Subjective:    Patient ID: Bethany Boyd, female    DOB: 05/03/1996, 20 y.o.   MRN: [id number]  HPI Patient had a E-visit yesterday. Amoxicillin 875 mg twice daily for 10 days was prescribed by a visit physician. Patient was told she probably had strep throat. Was complaining of respiratory congestion and sore throat. She is a Holiday representativejunior at [employer]. Needs note to return to class. Is missing class today. No nausea vomiting or diarrhea. No fever or shaking chills. Has malaise and fatigue.    Review of Systems see above     Objective:   Physical Exam Tonsils are red without exudate. Rapid strep screen done today after 2 doses of Amoxicillin is negative. TMs are full bilaterally. The left TM is slightly redder than the right. Neck is supple without adenopathy. Chest clear to auscultation.       Assessment & Plan:  Acute pharyngitis-could be streptococcal pharyngitis but she's had 2 doses of amoxicillin and that could have caused rapid strep screen to result is negative  Bilateral serous otitis media  Plan: Note to be out of school today. Continue amoxicillin as prescribed by E- visit physician and complete ten-day course. Tylenol as needed for pain.Call if not better by and of week or sooner if worse.

## 2016-01-25 NOTE — Patient Instructions (Signed)
Complete ten-day course of amoxicillin 875 mg twice daily as previously prescribed. Note to be out of class today. Tylenol as needed for fever or sore throat pain. Call if not better by end of week percent or if worse

## 2016-01-26 MED FILL — SAFYRAL TABLET: 3-0.03-0.45 | 28 days supply | Qty: 28 | Fill #11

## 2016-02-17 MED FILL — SAFYRAL TABLET: 3-0.03-0.45 | 28 days supply | Qty: 28 | Fill #12

## 2016-03-14 MED FILL — SAFYRAL TABLET: 3-0.03-0.45 | 28 days supply | Qty: 28 | Fill #0

## 2016-05-16 ENCOUNTER — Encounter: Payer: Self-pay | Admitting: Internal Medicine

## 2016-05-16 ENCOUNTER — Ambulatory Visit (INDEPENDENT_AMBULATORY_CARE_PROVIDER_SITE_OTHER): Payer: 59 | Admitting: Internal Medicine

## 2016-05-16 ENCOUNTER — Telehealth: Payer: Self-pay

## 2016-05-16 VITALS — BP 132/84 | HR 104 | Temp 98.7°F | Resp 20 | Wt 282.0 lb

## 2016-05-16 DIAGNOSIS — R300 Dysuria: Secondary | ICD-10-CM | POA: Diagnosis not present

## 2016-05-16 LAB — POCT URINALYSIS DIPSTICK
Bilirubin, UA: NEGATIVE
GLUCOSE UA: NEGATIVE
Ketones, UA: NEGATIVE
NITRITE UA: POSITIVE
PROTEIN UA: NEGATIVE
SPEC GRAV UA: 1.02
UROBILINOGEN UA: 0.2
pH, UA: 7

## 2016-05-16 MED ORDER — CIPROFLOXACIN HCL 500 MG PO TABS: 500.0000 mg | ORAL_TABLET | Freq: Two times a day (BID) | ORAL | Status: DC

## 2016-05-16 MED FILL — CIPROFLOXACIN HCL 500 MG TA: 500 | 10 days supply | Qty: 20 | Fill #0

## 2016-05-16 NOTE — Telephone Encounter (Signed)
Patient mother contacted office and thinks her daughter has a UTI- wants to know can she be seen sometime today. [phone]

## 2016-05-16 NOTE — Patient Instructions (Signed)
Cipro 500 mg twice daily for 10 days. Urine culture pending.

## 2016-05-16 NOTE — Progress Notes (Signed)
   Subjective:    Patient ID: Madlyn FrankelAllison M Potvin, female    DOB: Jul 24, 1996, 20 y.o.   MRN: [id number]  HPI Onset late last week of UTI symptoms. Has had frequency and dysuria. She thinks she may have had a low-grade fever around the onset of symptoms but not recently. No nausea, vomiting or back pain. She tried some over-the-counter medication to relieve dysuria. She had a UTI July 2016 treated in the emergency department. Culture grew greater than 100,000 colonies per milliliter Escherichia coli. At that time she was treated with Bactrim .   Review of Systems see above     Objective:   Physical Exam  No CVA tenderness. Urine dipstick shows 2+ LE. Culture is pending.      Assessment & Plan:  Acute urinary tract infection  Plan: Cipro 500 mg twice daily for 10 days.

## 2016-05-16 NOTE — Telephone Encounter (Signed)
Work her in today sometime

## 2016-05-16 NOTE — Telephone Encounter (Signed)
Pt to be seen at 4

## 2016-05-20 LAB — CULTURE, URINE COMPREHENSIVE: Colony Count: >=100000

## 2016-08-12 ENCOUNTER — Ambulatory Visit (INDEPENDENT_AMBULATORY_CARE_PROVIDER_SITE_OTHER): Payer: 59 | Admitting: Internal Medicine

## 2016-08-12 DIAGNOSIS — Z23 Encounter for immunization: Secondary | ICD-10-CM

## 2016-11-08 DIAGNOSIS — Z124 Encounter for screening for malignant neoplasm of cervix: Secondary | ICD-10-CM | POA: Diagnosis not present

## 2016-11-08 DIAGNOSIS — Z6841 Body Mass Index (BMI) 40.0 and over, adult: Secondary | ICD-10-CM | POA: Diagnosis not present

## 2016-11-08 DIAGNOSIS — Z01419 Encounter for gynecological examination (general) (routine) without abnormal findings: Secondary | ICD-10-CM | POA: Diagnosis not present

## 2016-11-08 MED FILL — NUVARING VAGINAL RING: 0.12-0.015 | 84 days supply | Qty: 3 | Fill #0

## 2016-11-10 ENCOUNTER — Encounter: Payer: Self-pay | Admitting: Internal Medicine

## 2016-11-10 ENCOUNTER — Ambulatory Visit (INDEPENDENT_AMBULATORY_CARE_PROVIDER_SITE_OTHER): Payer: 59 | Admitting: Internal Medicine

## 2016-11-10 VITALS — BP 118/80 | HR 84 | Temp 97.2°F | Resp 18 | Wt 261.0 lb

## 2016-11-10 DIAGNOSIS — J069 Acute upper respiratory infection, unspecified: Secondary | ICD-10-CM | POA: Diagnosis not present

## 2016-11-10 MED ORDER — AZITHROMYCIN 250 MG PO TABS: ORAL_TABLET | ORAL | 0 refills | Status: DC

## 2016-11-10 MED FILL — AZITHROMYCIN 250 MG TABLET: 250 | 5 days supply | Qty: 6 | Fill #0

## 2016-11-10 NOTE — Progress Notes (Signed)
   Subjective:    Patient ID: Bethany FrankelAllison M Boyd, female    DOB: 27-Dec-1995, 21 y.o.   MRN: [id number]  HPI mother came down with URI symptoms around Christmas and now  patient and her sister have URI symptoms    Review of Systems     Objective:   Physical Exam Skin warm and dry. Nodes none. TMs are slightly full bilaterally but not red. Pharynx very slightly injected. Neck is supple without adenopathy. Chest clear without rales or wheezing       Assessment & Plan:  Acute URI  Plan: She is going back to [employer] next week. Prescribed Zithromax Z-Pak to take as directed. Delsym as needed for cough.

## 2016-11-10 NOTE — Patient Instructions (Signed)
Zithromax Z-PAK as directed. Delsym as needed for cough. Rest and drink plenty of fluids.

## 2017-01-25 MED FILL — NUVARING VAGINAL RING: 0.12-0.015 | 84 days supply | Qty: 3 | Fill #1

## 2017-04-19 MED FILL — NUVARING VAGINAL RING: 0.12-0.015 | 84 days supply | Qty: 3 | Fill #2

## 2017-07-05 MED FILL — NUVARING VAGINAL RING: 0.12-0.015 | 84 days supply | Qty: 3 | Fill #3

## 2017-09-25 MED FILL — NUVARING VAGINAL RING: 0.12-0.015 | 84 days supply | Qty: 3 | Fill #4

## 2017-12-20 MED FILL — NUVARING VAGINAL RING: 0.12-0.015 | 28 days supply | Qty: 1 | Fill #0

## 2017-12-30 ENCOUNTER — Emergency Department
Admission: EM | Admit: 2017-12-30 | Discharge: 2017-12-30 | Disposition: A | Discharge status: Home / Self Care | Payer: 59 | Source: Home / Self Care

## 2017-12-30 ENCOUNTER — Emergency Department (INDEPENDENT_AMBULATORY_CARE_PROVIDER_SITE_OTHER): Payer: 59

## 2017-12-30 ENCOUNTER — Encounter: Payer: Self-pay | Admitting: Emergency Medicine

## 2017-12-30 DIAGNOSIS — Z23 Encounter for immunization: Secondary | ICD-10-CM

## 2017-12-30 DIAGNOSIS — X58XXXA Exposure to other specified factors, initial encounter: Secondary | ICD-10-CM

## 2017-12-30 DIAGNOSIS — M79672 Pain in left foot: Secondary | ICD-10-CM | POA: Diagnosis not present

## 2017-12-30 DIAGNOSIS — S92902A Unspecified fracture of left foot, initial encounter for closed fracture: Secondary | ICD-10-CM | POA: Diagnosis not present

## 2017-12-30 DIAGNOSIS — S92352A Displaced fracture of fifth metatarsal bone, left foot, initial encounter for closed fracture: Secondary | ICD-10-CM | POA: Diagnosis not present

## 2017-12-30 MED ORDER — INFLUENZA VAC SPLIT QUAD 0.5 ML IM SUSY
0.5000 mL | PREFILLED_SYRINGE | INTRAMUSCULAR | Status: AC
  Administered 2017-12-30: 0.5 mL via INTRAMUSCULAR

## 2017-12-30 MED ORDER — HYDROCODONE-ACETAMINOPHEN 5-325 MG PO TABS: 2.0000 | ORAL_TABLET | ORAL | 0 refills | Status: DC | PRN

## 2017-12-30 NOTE — ED Provider Notes (Signed)
[facility]    CSN: [id number] Arrival date & time: 12/30/17  1127     History   Chief Complaint Chief Complaint  Patient presents with  . Foot Pain    HPI Bethany Boyd is a 22 y.o. female.   The history is provided by the patient. No language interpreter was used.  Foot Pain  This is a new problem. The current episode started more than 1 week ago. The problem occurs constantly. The problem has been gradually worsening. Nothing aggravates the symptoms. Nothing relieves the symptoms. She has tried nothing for the symptoms.  Pt complains of pain in her left foot.  Pt reports pain is worse and foot is swollen.  Pt denies any injury  Past Medical History:  Diagnosis Date  . PCOS (polycystic ovarian syndrome)     Patient Active Problem List   Diagnosis Date Noted  . Acne 05/03/2013  . Obesity, unspecified 05/03/2013  . Polycystic ovary syndrome 01/11/2012    Past Surgical History:  Procedure Laterality Date  . MOUTH SURGERY     wisdom teeth    OB History    Gravida Para Term Preterm AB Living   0             SAB TAB Ectopic Multiple Live Births                   Home Medications    Prior to Admission medications   Medication Sig Start Date End Date Taking? Authorizing Provider  azithromycin (ZITHROMAX) 250 MG tablet 2 po day 1 followed by one po days 2-5 11/10/16   Margaree Mackintosh, MD  Drospirenone-Ethinyl Estradiol-Levomefol (SAFYRAL) 3-0.03-0.451 MG tablet Take 1 tablet by mouth daily. Patient not taking: Reported on 11/10/2016 05/15/13   Reva Bores, MD  etonogestrel-ethinyl estradiol (NUVARING) 0.12-0.015 MG/24HR vaginal ring Place 1 each vaginally every 28 (twenty-eight) days. Insert vaginally and leave in place for 3 consecutive weeks, then remove for 1 week.    [provider]  HYDROcodone-acetaminophen (NORCO/VICODIN) 5-325 MG tablet Take 2 tablets by mouth every 4 (four) hours as needed. 12/30/17   Elson Areas, PA-C  ibuprofen  (ADVIL,MOTRIN) 200 MG tablet Take 200 mg by mouth every 6 (six) hours as needed.    [provider]  valACYclovir (VALTREX) 500 MG tablet Take 1 tablet (500 mg total) by mouth 2 (two) times daily as needed. 05/16/13   Margaree Mackintosh, MD    Family History Family History  Problem Relation Age of Onset  . Asthma Mother   . Diabetes Mother   . Hypertension Father   . Asthma Sister   . Stroke Paternal Grandfather 14  . Breast cancer Paternal Grandmother 29  . Diabetes Maternal Grandmother   . Diabetes Maternal Grandfather     Social History Social History   Tobacco Use  . Smoking status: Never Smoker  . Smokeless tobacco: Never Used  Substance Use Topics  . Alcohol use: No  . Drug use: No     Allergies   Lorabid [loracarbef]   Review of Systems Review of Systems  All other systems reviewed and are negative.    Physical Exam Triage Vital Signs ED Triage Vitals  Enc Vitals Group     BP 12/30/17 1214 134/87     Pulse Rate 12/30/17 1214 80     Resp 12/30/17 1214 16     Temp 12/30/17 1214 98.2 F (36.8 C)     Temp Source 12/30/17  1214 Oral     SpO2 12/30/17 1214 98 %     Weight 12/30/17 1215 280 lb (127 kg)     Height 12/30/17 1215 5\' 11"  (1.803 m)     Head Circumference --      Peak Flow --      Pain Score 12/30/17 1214 8     Pain Loc --      Pain Edu? --      Excl. in GC? --    No data found.  Updated Vital Signs BP 134/87 (BP Location: Right Arm)   Pulse 80   Temp 98.2 F (36.8 C) (Oral)   Resp 16   Ht 5\' 11"  (1.803 m)   Wt 280 lb (127 kg)   LMP 12/25/2017   SpO2 98%   BMI 39.05 kg/m   Visual Acuity Right Eye Distance:   Left Eye Distance:   Bilateral Distance:    Right Eye Near:   Left Eye Near:    Bilateral Near:     Physical Exam  Constitutional: She appears well-developed and well-nourished.  Musculoskeletal: She exhibits tenderness.  Tender left 5th metatarsal,  Pain with movement,  nv and ns intact   Neurological: She is  alert.  Skin: Skin is warm.  Psychiatric: She has a normal mood and affect.  Nursing note and vitals reviewed.    UC Treatments / Results  Labs (all labs ordered are listed, but only abnormal results are displayed) Labs Reviewed - No data to display  EKG  EKG Interpretation None       Radiology Dg Foot Complete Left  Result Date: 12/30/2017 CLINICAL DATA:  Pt c/o left lateral foot pain x 1 week. Denies injury. EXAM: LEFT FOOT - COMPLETE 3+ VIEW COMPARISON:  None. FINDINGS: There is an incomplete transverse fracture across the metaphysis at the base of the fifth metatarsal. This has a subacute to chronic appearance, most likely an incompletely healed fracture. No other fractures.  No bone lesions. The joints are normally spaced and aligned. Mild soft tissue swelling is evident lateral to the fractured fifth metatarsal. IMPRESSION: 1. Incomplete fracture of the metaphysis of the proximal fifth metatarsal. This is most likely an incompletely healed subacute to chronic metaphysis fracture. 2. No other fractures.  No joint abnormalities. Electronically Signed   By: Amie Portland M.D.   On: 12/30/2017 12:37    Procedures Procedures (including critical care time)  Medications Ordered in UC Medications  Influenza vac split quadrivalent PF (FLUARIX) injection 0.5 mL (0.5 mLs Intramuscular Given 12/30/17 1318)     Initial Impression / Assessment and Plan / UC Course  I have reviewed the triage vital signs and the nursing notes.  Pertinent labs & imaging results that were available during my care of the patient were reviewed by me and considered in my medical decision making (see chart for details).   Pt request a flu shot.    Cam walker Crutches Meds ordered this encounter  Medications  . HYDROcodone-acetaminophen (NORCO/VICODIN) 5-325 MG tablet    Sig: Take 2 tablets by mouth every 4 (four) hours as needed.    Dispense:  10 tablet    Refill:  0    Order Specific Question:    Supervising Provider    Answer:   Georgina Pillion, DAVID [[id number]]  . Influenza vac split quadrivalent PF (FLUARIX) injection 0.5 mL    See Sports medicine or Orthopaedist of your choice for recheck this week.  Final Clinical Impressions(s) / UC Diagnoses  Final diagnoses:  Fracture, foot, left, closed, initial encounter    ED Discharge Orders        Ordered    HYDROcodone-acetaminophen (NORCO/VICODIN) 5-325 MG tablet  Every 4 hours PRN     12/30/17 1243       Controlled Substance Prescriptions [place] Controlled Substance Registry consulted? Yes, I have consulted the [place] Controlled Substances Registry for this patient, and feel the risk/benefit ratio today is favorable for proceeding with this prescription for a controlled substance.   Elson AreasSofia, Chandria Rookstool K, [place] 12/30/17 [phone]

## 2017-12-30 NOTE — ED Notes (Signed)
Demonstrated the placement of Cam Walker Boot And the use of crutches.Patient verbalizes an understanding.

## 2017-12-30 NOTE — ED Triage Notes (Signed)
[facility]O pain on the left foot worse in the lateral aspect. Pain 8/10 Sharp and aching. Pain times 1 week be pain is worse over the last two days.Denies injury

## 2018-01-02 DIAGNOSIS — S92302A Fracture of unspecified metatarsal bone(s), left foot, initial encounter for closed fracture: Secondary | ICD-10-CM | POA: Diagnosis not present

## 2018-01-08 ENCOUNTER — Telehealth: Payer: 59 | Admitting: Family

## 2018-01-08 DIAGNOSIS — J00 Acute nasopharyngitis [common cold]: Secondary | ICD-10-CM

## 2018-01-08 MED ORDER — BENZONATATE 100 MG PO CAPS: 100.0000 mg | ORAL_CAPSULE | Freq: Three times a day (TID) | ORAL | 0 refills | Status: DC | PRN

## 2018-01-08 MED ORDER — FLUTICASONE PROPIONATE 50 MCG/ACT NA SUSP: 2.0000 | Freq: Every day | NASAL | 1 refills | Status: DC

## 2018-01-08 NOTE — Progress Notes (Signed)
Thank you for the details you included in the comment boxes. Those details are very helpful in determining the best course of treatment for you and help us to provide the best care.  We are sorry you are not feeling well.  Here is how we plan to help!  Based on what you have shared with me, it looks like you may have a viral upper respiratory infection or a "common cold".  Colds are caused by a large number of viruses; however, rhinovirus is the most common cause.   Symptoms of the common cold vary from person to person, with common symptoms including sore throat, cough, and malaise.  A low-grade fever of 100.4 may present, but is often uncommon.  Symptoms vary however, and are closely related to a person's age or underlying illnesses.  The most common symptoms associated with the common cold are nasal discharge or congestion, cough, sneezing, headache and pressure in the ears and face.  Cold symptoms usually persist for about 3 to 10 days, but can last up to 2 weeks.  It is important to know that colds do not cause serious illness or complications in most cases.    The common cold is transmitted from person to person, with the most common method of transmission being a person's hands.  The virus is able to live on the skin and can infect other persons for up to 2 hours after direct contact.  Also, colds are transmitted when someone coughs or sneezes; thus, it is important to cover the mouth to reduce this risk.  To keep the spread of the common cold at bay, good hand hygiene is very important.  This is an infection that is most likely caused by a virus. There are no specific treatments for the common cold other than to help you with the symptoms until the infection runs its course.    For nasal congestion, you may use an oral decongestants such as Mucinex D or if you have glaucoma or high blood pressure use plain Mucinex.  Saline nasal spray or nasal drops can help and can safely be used as often as  needed for congestion.  For your congestion, I have prescribed Fluticasone nasal spray one spray in each nostril twice a day  If you do not have a history of heart disease, hypertension, diabetes or thyroid disease, prostate/bladder issues or glaucoma, you may also use Sudafed to treat nasal congestion.  It is highly recommended that you consult with a pharmacist or your primary care physician to ensure this medication is safe for you to take.     If you have a cough, you may use cough suppressants such as Delsym and Robitussin.  If you have glaucoma or high blood pressure, you can also use Coricidin HBP.   For cough I have prescribed for you A prescription cough medication called Tessalon Perles 100 mg. You may take 1-2 capsules every 8 hours as needed for cough  If you have a sore or scratchy throat, use a saltwater gargle-  to  teaspoon of salt dissolved in a 4-ounce to 8-ounce glass of warm water.  Gargle the solution for approximately 15-30 seconds and then spit.  It is important not to swallow the solution.  You can also use throat lozenges/cough drops and Chloraseptic spray to help with throat pain or discomfort.  Warm or cold liquids can also be helpful in relieving throat pain.  For headache, pain or general discomfort, you can use Ibuprofen or Tylenol   as directed.   Some authorities believe that zinc sprays or the use of Echinacea may shorten the course of your symptoms.   HOME CARE . Only take medications as instructed by your medical team. . Be sure to drink plenty of fluids. Water is fine as well as fruit juices, sodas and electrolyte beverages. You may want to stay away from caffeine or alcohol. If you are nauseated, try taking small sips of liquids. How do you know if you are getting enough fluid? Your urine should be a pale yellow or almost colorless. . Get rest. . Taking a steamy shower or using a humidifier may help nasal congestion and ease sore throat pain. You can place a  towel over your head and breathe in the steam from hot water coming from a faucet. . Using a saline nasal spray works much the same way. . Cough drops, hard candies and sore throat lozenges may ease your cough. . Avoid close contacts especially the very young and the elderly . Cover your mouth if you cough or sneeze . Always remember to wash your hands.   GET HELP RIGHT AWAY IF: . You develop worsening fever. . If your symptoms do not improve within 10 days . You become short of breath. . You develop yellow or green discharge from your nose over 3 days. . You have coughing fits . You develop a severe head ache or visual changes. . You develop shortness of breath or difficulty breathing. . Your symptoms persist after you have completed your treatment plan  MAKE SURE YOU   Understand these instructions.  Will watch your condition.  Will get help right away if you are not doing well or get worse.  Your e-visit answers were reviewed by a board certified advanced clinical practitioner to complete your personal care plan. Depending upon the condition, your plan could have included both over the counter or prescription medications. Please review your pharmacy choice. If there is a problem, you may call our nursing hot line at and have the prescription routed to another pharmacy. Your safety is important to us. If you have drug allergies check your prescription carefully.   You can use MyChart to ask questions about today's visit, request a non-urgent call back, or ask for a work or school excuse for 24 hours related to this e-Visit. If it has been greater than 24 hours you will need to follow up with your provider, or enter a new e-Visit to address those concerns. You will get an e-mail in the next two days asking about your experience.  I hope that your e-visit has been valuable and will speed your recovery. Thank you for using e-visits.       

## 2018-01-26 DIAGNOSIS — M79672 Pain in left foot: Secondary | ICD-10-CM | POA: Diagnosis not present

## 2018-01-26 DIAGNOSIS — S92355D Nondisplaced fracture of fifth metatarsal bone, left foot, subsequent encounter for fracture with routine healing: Secondary | ICD-10-CM | POA: Diagnosis not present

## 2018-03-09 DIAGNOSIS — S92355D Nondisplaced fracture of fifth metatarsal bone, left foot, subsequent encounter for fracture with routine healing: Secondary | ICD-10-CM | POA: Diagnosis not present

## 2018-05-23 DIAGNOSIS — J029 Acute pharyngitis, unspecified: Secondary | ICD-10-CM | POA: Diagnosis not present

## 2018-05-23 DIAGNOSIS — R070 Pain in throat: Secondary | ICD-10-CM | POA: Diagnosis not present

## 2018-05-28 DIAGNOSIS — J029 Acute pharyngitis, unspecified: Secondary | ICD-10-CM | POA: Diagnosis not present

## 2018-06-05 ENCOUNTER — Ambulatory Visit (INDEPENDENT_AMBULATORY_CARE_PROVIDER_SITE_OTHER): Payer: 59 | Admitting: Internal Medicine

## 2018-06-05 ENCOUNTER — Encounter: Payer: Self-pay | Admitting: Internal Medicine

## 2018-06-05 VITALS — BP 140/90 | HR 110 | Ht 71.0 in | Wt 302.0 lb

## 2018-06-05 DIAGNOSIS — B3749 Other urogenital candidiasis: Secondary | ICD-10-CM | POA: Diagnosis not present

## 2018-06-05 DIAGNOSIS — N898 Other specified noninflammatory disorders of vagina: Secondary | ICD-10-CM | POA: Diagnosis not present

## 2018-06-05 LAB — POCT WET PREP (WET MOUNT)

## 2018-06-05 MED ORDER — TERCONAZOLE 0.4 % VA CREA: 1.0000 | TOPICAL_CREAM | Freq: Every day | VAGINAL | 2 refills | Status: DC

## 2018-06-05 MED FILL — TERCONAZOLE 0.4% VAG CREAM: 0.4 | 7 days supply | Qty: 45 | Fill #0

## 2018-06-05 NOTE — Patient Instructions (Signed)
Use Terazol 7 vaginal cream to external genitalia twice a day.  Take an additional tablet of Diflucan later this week.

## 2018-06-05 NOTE — Progress Notes (Signed)
   Subjective:    Patient ID: Madlyn FrankelAllison M Wien, female    DOB: 05-13-1996, 22 y.o.   MRN: [id number]  HPI 22 year old Female seen in urgent care in [place] and apparently had pharyngitis although she says strep test was negative she was told she was being treated for strep throat.  Was given amoxicillin and prednisone.  Subsequently developed Candida vaginitis.  She called urgent care and they prescribed Diflucan with 1 refill.  She took 1 dose and is to take the second dose later this week.  She is having itching in her upper genital area around her clitoris.  If she is anxious and concerned.  Recently found that grandmother is been diagnosed with breast cancer and is tearful in the office today.  Was recently sexually active but uses condoms.  Says she would like to be checked for STDs.  Pharyngitis symptoms improving.   Review of Systems see above-says she did see heavy discharge but not recently     Objective:   Physical Exam There is no heavy discharge in vagina.  GC and Chlamydia probes taken.  Wet prep shows budding yeast and few bacteria.  There is some erythema about clitoris with debris       Assessment & Plan:  Candida infection of genital area  Candida vaginitis  Plan: Repeat Diflucan as previously prescribed.  Terazol 7 vaginal cream to use in external genitalia area HES.  Patient reassured this will resolve.

## 2018-06-06 LAB — C. TRACHOMATIS/N. GONORRHOEAE RNA
C. TRACHOMATIS RNA, TMA: NOT DETECTED
N. gonorrhoeae RNA, TMA: NOT DETECTED

## 2018-06-11 ENCOUNTER — Other Ambulatory Visit: Payer: Self-pay | Admitting: Internal Medicine

## 2018-06-11 DIAGNOSIS — Z13220 Encounter for screening for lipoid disorders: Secondary | ICD-10-CM

## 2018-06-11 DIAGNOSIS — Z1329 Encounter for screening for other suspected endocrine disorder: Secondary | ICD-10-CM

## 2018-06-11 DIAGNOSIS — Z1321 Encounter for screening for nutritional disorder: Secondary | ICD-10-CM

## 2018-06-11 DIAGNOSIS — Z0000 Encounter for general adult medical examination without abnormal findings: Secondary | ICD-10-CM

## 2018-06-12 ENCOUNTER — Ambulatory Visit: Payer: 59 | Admitting: Internal Medicine

## 2018-06-12 ENCOUNTER — Other Ambulatory Visit (HOSPITAL_COMMUNITY)
Admission: RE | Admit: 2018-06-12 | Discharge: 2018-06-12 | Disposition: A | Discharge status: Home / Self Care | Payer: 59 | Source: Ambulatory Visit | Attending: Internal Medicine | Admitting: Internal Medicine

## 2018-06-12 ENCOUNTER — Ambulatory Visit (INDEPENDENT_AMBULATORY_CARE_PROVIDER_SITE_OTHER): Payer: 59 | Admitting: Internal Medicine

## 2018-06-12 ENCOUNTER — Other Ambulatory Visit: Payer: 59 | Admitting: Internal Medicine

## 2018-06-12 VITALS — BP 140/90 | HR 104 | Ht 71.0 in | Wt 312.0 lb

## 2018-06-12 DIAGNOSIS — Z124 Encounter for screening for malignant neoplasm of cervix: Secondary | ICD-10-CM | POA: Diagnosis not present

## 2018-06-12 DIAGNOSIS — Z6841 Body Mass Index (BMI) 40.0 and over, adult: Secondary | ICD-10-CM

## 2018-06-12 DIAGNOSIS — Z0000 Encounter for general adult medical examination without abnormal findings: Secondary | ICD-10-CM

## 2018-06-12 DIAGNOSIS — Z13220 Encounter for screening for lipoid disorders: Secondary | ICD-10-CM

## 2018-06-12 DIAGNOSIS — E282 Polycystic ovarian syndrome: Secondary | ICD-10-CM

## 2018-06-12 DIAGNOSIS — L739 Follicular disorder, unspecified: Secondary | ICD-10-CM | POA: Diagnosis not present

## 2018-06-12 DIAGNOSIS — Z131 Encounter for screening for diabetes mellitus: Secondary | ICD-10-CM

## 2018-06-12 DIAGNOSIS — Z1329 Encounter for screening for other suspected endocrine disorder: Secondary | ICD-10-CM | POA: Diagnosis not present

## 2018-06-12 DIAGNOSIS — Z1321 Encounter for screening for nutritional disorder: Secondary | ICD-10-CM | POA: Diagnosis not present

## 2018-06-12 LAB — POCT URINALYSIS DIPSTICK
Bilirubin, UA: NEGATIVE
Glucose, UA: NEGATIVE
KETONES UA: NEGATIVE
LEUKOCYTES UA: NEGATIVE
NITRITE UA: NEGATIVE
PH UA: 6 (ref 5.0–8.0)
PROTEIN UA: POSITIVE — AB
Spec Grav, UA: 1.015 (ref 1.010–1.025)
Urobilinogen, UA: 0.2 E.U./dL

## 2018-06-12 MED ORDER — CLINDAMYCIN PHOSPHATE 1 % EX LOTN: TOPICAL_LOTION | CUTANEOUS | 0 refills | Status: AC

## 2018-06-12 MED ORDER — ETONOGESTREL-ETHINYL ESTRADIOL 0.12-0.015 MG/24HR VA RING: VAGINAL_RING | VAGINAL | 12 refills | Status: AC

## 2018-06-12 MED FILL — CLINDAMYCIN PHOSP 1% LOTION: 1 | 30 days supply | Qty: 60 | Fill #0

## 2018-06-12 MED FILL — NUVARING VAGINAL RING: 0.12-0.015 | 84 days supply | Qty: 3 | Fill #0

## 2018-06-12 NOTE — Progress Notes (Signed)
   Subjective:    Patient ID: Bethany FrankelAllison M Boyd, female    DOB: 1996-09-25, 22 y.o.   MRN: [id number]  HPI 22 year old White Female for health maintenance exam and evaluation of medical issues. Labs drawn and pending earlier today. Seen here July 30 for genital itching [place] [facility] [place]. Feeling better now. Recent STD check negative. Treated July 30th with Terazol Cream and Diflucan. Now here for CPE.  Hx polycystic ovary syndrome. Sexually active one partner. Hx dislocated shoulder eight grade. Has Nuvaring.  Family history: Mother with history of diabetes mellitus and asthma as well as hypertension.  Both parents are overweight.  Father with history of diabetes and hypertension.  Patient has 1 sister who is healthy.  Patient does not smoke or use drugs.  She is a Radiographer, therapeuticgraduate of [employer] and currently is working in [place] but living in [place].  Dr. Zenaida NieceAmos was her Pediatrician.  She has seen Dr. Nicholas LoseLomax for polycystic ovary syndrome but he has retired.  She was unable to tolerate metformin because it caused diarrhea.  Review of Systems  Constitutional: Negative.   All other systems reviewed and are negative.      Objective:   Physical Exam  Constitutional: She is oriented to person, place, and time. She appears well-developed and well-nourished. No distress.  HENT:  Head: Normocephalic and atraumatic.  Right Ear: External ear normal.  Left Ear: External ear normal.  Nose: Nose normal.  Mouth/Throat: Oropharynx is clear and moist.  Eyes: Pupils are equal, round, and reactive to light. Conjunctivae and EOM are normal. Right eye exhibits no discharge. Left eye exhibits no discharge. No scleral icterus.  Neck: Neck supple. No JVD present. No thyromegaly present.  Cardiovascular: Regular rhythm, normal heart sounds and intact distal pulses. Exam reveals no friction rub.  No murmur heard. Pulmonary/Chest: Effort normal. No stridor. No respiratory distress.  She has no wheezes. She has no rales.  Abdominal: Soft. She exhibits no distension and no mass. There is no tenderness. There is no rebound. No hernia.  Genitourinary:  Genitourinary Comments: Pap taken. No discharge  Musculoskeletal: She exhibits no deformity.  Lymphadenopathy:    She has no cervical adenopathy.  Neurological: She is alert and oriented to person, place, and time. She displays normal reflexes. No cranial nerve deficit. She exhibits normal muscle tone. Coordination normal.  Skin: Skin is dry. No erythema.  Multiple areas of scarring from carbuncles inner thighs and perineum. Try Cleocin T daily to prevent  Psychiatric: She has a normal mood and affect. Her behavior is normal. Judgment and thought content normal.  Vitals reviewed.         Assessment & Plan:  BMI 43  Recent Candida infection genital area-resolved  Carbuncles inner thigh and perineum- Try Cleocin T daily  Recent pharyngitis. LFTs mildly elevated. Need repeating in 2 weeks. ? Viral syndrome Pt says was taking a lot of NSAIDS with recent pharyngitis

## 2018-06-13 LAB — CBC WITH DIFFERENTIAL/PLATELET
BASOS PCT: 1.3 %
Basophils Absolute: 48 cells/uL (ref 0–200)
EOS PCT: 0.5 %
Eosinophils Absolute: 19 cells/uL (ref 15–500)
HCT: 41.5 % (ref 35.0–45.0)
Hemoglobin: 14.2 g/dL (ref 11.7–15.5)
Lymphs Abs: 1058 cells/uL (ref 850–3900)
MCH: 29.5 pg (ref 27.0–33.0)
MCHC: 34.2 g/dL (ref 32.0–36.0)
MCV: 86.3 fL (ref 80.0–100.0)
MONOS PCT: 8.6 %
MPV: 10.6 fL (ref 7.5–12.5)
NEUTROS ABS: 2257 {cells}/uL (ref 1500–7800)
Neutrophils Relative %: 61 %
PLATELETS: 179 10*3/uL (ref 140–400)
RBC: 4.81 10*6/uL (ref 3.80–5.10)
RDW: 12.2 % (ref 11.0–15.0)
TOTAL LYMPHOCYTE: 28.6 %
WBC mixed population: 318 cells/uL (ref 200–950)
WBC: 3.7 10*3/uL — ABNORMAL LOW (ref 3.8–10.8)

## 2018-06-13 LAB — COMPLETE METABOLIC PANEL WITH GFR
AG RATIO: 1.2 (calc) (ref 1.0–2.5)
ALKALINE PHOSPHATASE (APISO): 75 U/L (ref 33–115)
ALT: 100 U/L — ABNORMAL HIGH (ref 6–29)
AST: 68 U/L — AB (ref 10–30)
Albumin: 3.6 g/dL (ref 3.6–5.1)
BILIRUBIN TOTAL: 1.2 mg/dL (ref 0.2–1.2)
BUN: 12 mg/dL (ref 7–25)
CHLORIDE: 105 mmol/L (ref 98–110)
CO2: 26 mmol/L (ref 20–32)
Calcium: 8.7 mg/dL (ref 8.6–10.2)
Creat: 0.68 mg/dL (ref 0.50–1.10)
GFR, Est African American: 143 mL/min/{1.73_m2} (ref >=60)
GFR, Est Non African American: 123 mL/min/{1.73_m2} (ref >=60)
Globulin: 3.1 g/dL (calc) (ref 1.9–3.7)
Glucose, Bld: 95 mg/dL (ref 65–99)
POTASSIUM: 4 mmol/L (ref 3.5–5.3)
SODIUM: 140 mmol/L (ref 135–146)
Total Protein: 6.7 g/dL (ref 6.1–8.1)

## 2018-06-13 LAB — TSH: TSH: 0.75 mIU/L

## 2018-06-13 LAB — HEMOGLOBIN A1C
EAG (MMOL/L): 5.7 (calc)
HEMOGLOBIN A1C: 5.2 %{Hb} (ref <5.7)
Mean Plasma Glucose: 103 (calc)

## 2018-06-13 LAB — LIPID PANEL
CHOL/HDL RATIO: 5.3 (calc) — AB (ref <5.0)
Cholesterol: 163 mg/dL (ref <200)
HDL: 31 mg/dL — AB (ref >50)
LDL CHOLESTEROL (CALC): 107 mg/dL — AB
Non-HDL Cholesterol (Calc): 132 mg/dL (calc) — ABNORMAL HIGH (ref <130)
TRIGLYCERIDES: 140 mg/dL (ref <150)

## 2018-06-13 LAB — VITAMIN D 25 HYDROXY (VIT D DEFICIENCY, FRACTURES): Vit D, 25-Hydroxy: 17 ng/mL — ABNORMAL LOW (ref 30–100)

## 2018-06-14 ENCOUNTER — Ambulatory Visit: Payer: 59 | Admitting: Internal Medicine

## 2018-06-14 ENCOUNTER — Encounter: Payer: Self-pay | Admitting: Emergency Medicine

## 2018-06-14 ENCOUNTER — Emergency Department
Admission: EM | Admit: 2018-06-14 | Discharge: 2018-06-14 | Disposition: A | Discharge status: Home / Self Care | Payer: 59 | Source: Home / Self Care | Attending: Emergency Medicine | Admitting: Emergency Medicine

## 2018-06-14 ENCOUNTER — Telehealth: Payer: Self-pay | Admitting: Emergency Medicine

## 2018-06-14 ENCOUNTER — Other Ambulatory Visit: Payer: Self-pay

## 2018-06-14 ENCOUNTER — Encounter: Payer: Self-pay | Admitting: Internal Medicine

## 2018-06-14 ENCOUNTER — Other Ambulatory Visit: Payer: Self-pay | Admitting: Internal Medicine

## 2018-06-14 DIAGNOSIS — R509 Fever, unspecified: Secondary | ICD-10-CM

## 2018-06-14 DIAGNOSIS — R5081 Fever presenting with conditions classified elsewhere: Secondary | ICD-10-CM | POA: Diagnosis not present

## 2018-06-14 DIAGNOSIS — R945 Abnormal results of liver function studies: Secondary | ICD-10-CM

## 2018-06-14 DIAGNOSIS — D709 Neutropenia, unspecified: Secondary | ICD-10-CM

## 2018-06-14 DIAGNOSIS — R7989 Other specified abnormal findings of blood chemistry: Secondary | ICD-10-CM

## 2018-06-14 LAB — POCT CBC W AUTO DIFF (K'VILLE URGENT CARE)

## 2018-06-14 LAB — CYTOLOGY - PAP
ADEQUACY: ABSENT
Diagnosis: NEGATIVE
HPV: NOT DETECTED

## 2018-06-14 NOTE — ED Triage Notes (Signed)
Patient reports onset of intermittent fever past 2 days; last night 102.5; took tylenol. Denies nausea, vomiting, diarrhea, sore throat, general aches.

## 2018-06-14 NOTE — Addendum Note (Signed)
Addended by: Gregery NaVALENCIA, Ijeoma Loor P on: 06/14/2018 11:33 AM   Modules accepted: Orders

## 2018-06-14 NOTE — Patient Instructions (Signed)
RTC 2 weeks to repeat LFTs. Nuvaring RX refilled

## 2018-06-14 NOTE — Telephone Encounter (Signed)
Please ask her to go to Urgent Care to be checked out if continues to run fever. I do not know a reason for fever at this time. Wondering about mononucleosis or cytomegalovirus with elevated liver functions.

## 2018-06-14 NOTE — ED Notes (Signed)
Successful blood draw to right AC x 1

## 2018-06-14 NOTE — Addendum Note (Signed)
Addended by: Gregery NaVALENCIA, Keerat Denicola P on: 06/14/2018 11:48 AM   Modules accepted: Orders

## 2018-06-14 NOTE — Discharge Instructions (Signed)
We will call you with test results. We will try and limit your use of Tylenol and ibuprofen due to your elevated liver tests. Marland Kitchen.  Keep your follow-up with Dr. Lenord FellersBaxley.

## 2018-06-14 NOTE — Telephone Encounter (Signed)
Spoke with patient and notified of Dr. Beryle QuantBaxley's instructions.

## 2018-06-14 NOTE — ED Provider Notes (Signed)
[name]    CSN: [id number] Arrival date & time: 06/14/18  1116     History   Chief Complaint Chief Complaint  Patient presents with  . Fever    HPI Bethany Boyd is a 22 y.o. female.  Patient's illness has been going on since late July.  She was initially seen in [place] with a sore throat.  Strep testing was negative.  She returned to the clinic and had a mono  which was negative and she was treated with Augmentin and prednisone.  She subsequently developed a yeast infection and had to take Diflucan.  She was seen by her primary care physician 2 days ago and had her routine physical.  During her routine physical it was found that she has elevated liver tests.  She continues to feel poorly with fever sore throat aching and extreme fatigue. HPI  Past Medical History:  Diagnosis Date  . PCOS (polycystic ovarian syndrome)     Patient Active Problem List   Diagnosis Date Noted  . Acne 05/03/2013  . Obesity, unspecified 05/03/2013  . Polycystic ovary syndrome 01/11/2012    Past Surgical History:  Procedure Laterality Date  . MOUTH SURGERY     wisdom teeth    OB History    Gravida  0   Para      Term      Preterm      AB      Living        SAB      TAB      Ectopic      Multiple      Live Births               Home Medications    Prior to Admission medications   Medication Sig Start Date End Date Taking? Authorizing Provider  clindamycin (CLEOCIN-T) 1 % lotion Apply topically nightly as directed. 06/12/18   Margaree Mackintosh, MD  etonogestrel-ethinyl estradiol (NUVARING) 0.12-0.015 MG/24HR vaginal ring Insert vaginally and leave in place for 3 consecutive weeks, then remove for 1 week. 06/12/18   Margaree Mackintosh, MD  valACYclovir (VALTREX) 500 MG tablet Take 1 tablet (500 mg total) by mouth 2 (two) times daily as needed. 05/16/13   Margaree Mackintosh, MD    Family History Family History  Problem Relation Age of Onset  . Asthma Mother   .  Diabetes Mother   . Hypertension Father   . Asthma Sister   . Stroke Paternal Grandfather 7  . Breast cancer Paternal Grandmother 88  . Diabetes Maternal Grandmother   . Diabetes Maternal Grandfather     Social History Social History   Tobacco Use  . Smoking status: Never Smoker  . Smokeless tobacco: Never Used  Substance Use Topics  . Alcohol use: No  . Drug use: No     Allergies   Lorabid [loracarbef]   Review of Systems Review of Systems  Constitutional: Positive for fatigue and fever.  HENT: Positive for sore throat.   Eyes: Negative.   Respiratory: Negative.   Cardiovascular: Negative.   Gastrointestinal: Negative.   Genitourinary: Negative.   Musculoskeletal: Negative.   Neurological: Negative.   Psychiatric/Behavioral: Negative.      Physical Exam Triage Vital Signs ED Triage Vitals  Enc Vitals Group     BP 06/14/18 1138 117/81     Pulse Rate 06/14/18 1138 (!) 112     Resp 06/14/18 1138 18     Temp 06/14/18 1138  99.8 F (37.7 C)     Temp Source 06/14/18 1138 Oral     SpO2 06/14/18 1138 98 %     Weight 06/14/18 1139 220 lb (99.8 kg)     Height 06/14/18 1139 5\' 11"  (1.803 m)     Head Circumference --      Peak Flow --      Pain Score 06/14/18 1139 0     Pain Loc --      Pain Edu? --      Excl. in GC? --    No data found.  Updated Vital Signs BP 117/81 (BP Location: Right Arm)   Pulse (!) 112   Temp 99.8 F (37.7 C) (Oral)   Resp 18   Ht 5\' 11"  (1.803 m)   Wt 99.8 kg   LMP 06/12/2018 (Exact Date) Comment: PATIENT IS CURRENTLY MENSTRUATING   SpO2 98%   BMI 30.68 kg/m   Visual Acuity Right Eye Distance:   Left Eye Distance:   Bilateral Distance:    Right Eye Near:   Left Eye Near:    Bilateral Near:     Physical Exam  Constitutional:  Overweight female in no distress.  HENT:  Head: Normocephalic and atraumatic.  Eyes: Pupils are equal, round, and reactive to light. Conjunctivae are normal.  Neck: Normal range of motion. Neck  supple.  Cardiovascular: Normal rate and regular rhythm.  Pulmonary/Chest: Effort normal and breath sounds normal.  Abdominal: Soft. There is tenderness.  There is tenderness right upper abdomen without mass.  White count 2900 with PE PE but is 34% lymphocytes hemoglobin 13.1 platelets 137,000.   UC Treatments / Results  Labs (all labs ordered are listed, but only abnormal results are displayed) Labs Reviewed  EPSTEIN-BARR VIRUS VCA, IGM  EPSTEIN-BARR VIRUS VCA, IGG  EPSTEIN-BARR VIRUS NUCLEAR ANTIGEN ANTIBODY, IGG  EPSTEIN-BARR VIRUS EARLY D ANTIGEN ANTIBODY, IGG  HIV ANTIBODY (ROUTINE TESTING)  HEPATITIS PANEL, ACUTE  CMV ABS, IGG+IGM (CYTOMEGALOVIRUS)  POCT CBC W AUTO DIFF ([facility])    EKG None  Radiology No results found.  Procedures Procedures (including critical care time)  Medications Ordered in UC Medications - No data to display  Initial Impression / Assessment and Plan / UC Course  I have reviewed the triage vital signs and the nursing notes.  Pertinent labs & imaging results that were available during my care of the patient were reviewed by me and considered in my medical decision making (see chart for details). Her history and exam as well as her abnormal lab values would say she has either CMV or mono.  Appropriate lab tests were done to include HIV and acute hepatitis panel.  Follow-up after these tests are done.     Final Clinical Impressions(s) / UC Diagnoses   Final diagnoses:  Fever and neutropenia (HCC)  Liver function test abnormality     Discharge Instructions     We will call you with test results. We will try and limit your use of Tylenol and ibuprofen due to your elevated liver tests. Marland Kitchen.  Keep your follow-up with Dr. Lenord FellersBaxley.    ED Prescriptions    None     Controlled Substance Prescriptions [place] Controlled Substance Registry consulted? Not Applicable   Collene Gobbleaub, Rachel Samples A, MD 06/14/18 1459

## 2018-06-14 NOTE — Telephone Encounter (Signed)
Pt called and stated she was suppose to call back if she was running another fever. She said she was running a fever of 102.5 last night and wanted to let you know. Thanks.

## 2018-06-15 LAB — HEPATITIS PANEL, ACUTE
HEP A IGM: NONREACTIVE
HEP B C IGM: NONREACTIVE
Hepatitis B Surface Ag: NONREACTIVE
Hepatitis C Ab: NONREACTIVE
SIGNAL TO CUT-OFF: 0.13 (ref <1.00)

## 2018-06-15 LAB — EPSTEIN-BARR VIRUS NUCLEAR ANTIGEN ANTIBODY, IGG: EBV NA IgG: 24.3 U/mL — ABNORMAL HIGH

## 2018-06-15 LAB — EPSTEIN-BARR VIRUS EARLY D ANTIGEN ANTIBODY, IGG: EBV EA IgG: <9 U/mL

## 2018-06-15 LAB — EPSTEIN-BARR VIRUS VCA, IGG

## 2018-06-15 LAB — CMV ABS, IGG+IGM (CYTOMEGALOVIRUS)
CMV IgM: <30 AU/mL
Cytomegalovirus Ab-IgG: <0.6 U/mL

## 2018-06-15 LAB — HIV ANTIBODY (ROUTINE TESTING W REFLEX): HIV: NONREACTIVE

## 2018-06-15 LAB — EPSTEIN-BARR VIRUS VCA, IGM: EBV VCA IgM: <36 U/mL

## 2018-06-16 ENCOUNTER — Telehealth: Payer: Self-pay | Admitting: Emergency Medicine

## 2018-06-16 NOTE — Telephone Encounter (Signed)
Spoke with patient via telephone, gave lab results via telephone,negative per Dr Cleta Albertsaub recommends that patient follow up with her PCP or [facility] to have some repeated lab work.

## 2018-06-17 ENCOUNTER — Encounter: Payer: Self-pay | Admitting: Emergency Medicine

## 2018-06-18 ENCOUNTER — Encounter: Payer: Self-pay | Admitting: Internal Medicine

## 2018-06-18 ENCOUNTER — Encounter (HOSPITAL_COMMUNITY): Payer: Self-pay

## 2018-06-18 ENCOUNTER — Emergency Department (HOSPITAL_COMMUNITY): Payer: 59

## 2018-06-18 ENCOUNTER — Emergency Department (HOSPITAL_COMMUNITY)
Admission: EM | Admit: 2018-06-18 | Discharge: 2018-06-19 | Disposition: A | Discharge status: Home / Self Care | Payer: 59 | Attending: Emergency Medicine | Admitting: Emergency Medicine

## 2018-06-18 ENCOUNTER — Other Ambulatory Visit: Payer: Self-pay

## 2018-06-18 ENCOUNTER — Ambulatory Visit (INDEPENDENT_AMBULATORY_CARE_PROVIDER_SITE_OTHER): Payer: 59 | Admitting: Internal Medicine

## 2018-06-18 VITALS — BP 100/70 | HR 114 | Temp 98.1°F | Ht 71.0 in | Wt 304.0 lb

## 2018-06-18 DIAGNOSIS — R7989 Other specified abnormal findings of blood chemistry: Secondary | ICD-10-CM

## 2018-06-18 DIAGNOSIS — R059 Cough, unspecified: Secondary | ICD-10-CM

## 2018-06-18 DIAGNOSIS — R8290 Unspecified abnormal findings in urine: Secondary | ICD-10-CM

## 2018-06-18 DIAGNOSIS — R822 Biliuria: Secondary | ICD-10-CM

## 2018-06-18 DIAGNOSIS — R740 Nonspecific elevation of levels of transaminase and lactic acid dehydrogenase [LDH]: Secondary | ICD-10-CM

## 2018-06-18 DIAGNOSIS — R945 Abnormal results of liver function studies: Secondary | ICD-10-CM

## 2018-06-18 DIAGNOSIS — R05 Cough: Secondary | ICD-10-CM

## 2018-06-18 DIAGNOSIS — R17 Unspecified jaundice: Secondary | ICD-10-CM | POA: Diagnosis not present

## 2018-06-18 DIAGNOSIS — R509 Fever, unspecified: Secondary | ICD-10-CM

## 2018-06-18 DIAGNOSIS — R7401 Elevation of levels of liver transaminase levels: Secondary | ICD-10-CM

## 2018-06-18 LAB — CBC WITH DIFFERENTIAL/PLATELET
BASOS PCT: 1.1 %
Basophils Absolute: 106 cells/uL (ref 0–200)
EOS PCT: 0.2 %
Eosinophils Absolute: 19 cells/uL (ref 15–500)
HEMATOCRIT: 37.9 % (ref 35.0–45.0)
Hemoglobin: 12.7 g/dL (ref 11.7–15.5)
Lymphs Abs: 6211 cells/uL — ABNORMAL HIGH (ref 850–3900)
MCH: 29.5 pg (ref 27.0–33.0)
MCHC: 33.5 g/dL (ref 32.0–36.0)
MCV: 88.1 fL (ref 80.0–100.0)
MPV: 12 fL (ref 7.5–12.5)
Monocytes Relative: 14.3 %
NEUTROS PCT: 19.7 %
Neutro Abs: 1891 cells/uL (ref 1500–7800)
PLATELETS: 159 10*3/uL (ref 140–400)
RBC: 4.3 10*6/uL (ref 3.80–5.10)
RDW: 12.8 % (ref 11.0–15.0)
TOTAL LYMPHOCYTE: 64.7 %
WBC: 9.6 10*3/uL (ref 3.8–10.8)
WBCMIX: 1373 {cells}/uL — AB (ref 200–950)

## 2018-06-18 LAB — COMPLETE METABOLIC PANEL WITH GFR
AG Ratio: 1 (calc) (ref 1.0–2.5)
ALT: 175 U/L — AB (ref 6–29)
AST: 121 U/L — AB (ref 10–30)
Albumin: 3.4 g/dL — ABNORMAL LOW (ref 3.6–5.1)
Alkaline phosphatase (APISO): 167 U/L — ABNORMAL HIGH (ref 33–115)
BUN: 7 mg/dL (ref 7–25)
CO2: 24 mmol/L (ref 20–32)
CREATININE: 0.61 mg/dL (ref 0.50–1.10)
Calcium: 8.5 mg/dL — ABNORMAL LOW (ref 8.6–10.2)
Chloride: 101 mmol/L (ref 98–110)
GFR, Est African American: 148 mL/min/{1.73_m2} (ref >=60)
GFR, Est Non African American: 128 mL/min/{1.73_m2} (ref >=60)
GLOBULIN: 3.4 g/dL (ref 1.9–3.7)
GLUCOSE: 109 mg/dL — AB (ref 65–99)
Potassium: 3.4 mmol/L — ABNORMAL LOW (ref 3.5–5.3)
SODIUM: 135 mmol/L (ref 135–146)
Total Bilirubin: 5.2 mg/dL — ABNORMAL HIGH (ref 0.2–1.2)
Total Protein: 6.8 g/dL (ref 6.1–8.1)

## 2018-06-18 LAB — POCT URINALYSIS DIPSTICK
APPEARANCE: ABNORMAL
Bilirubin, UA: POSITIVE
GLUCOSE UA: NEGATIVE
Ketones, UA: NEGATIVE
NITRITE UA: POSITIVE
Odor: ABNORMAL
PROTEIN UA: POSITIVE — AB
RBC UA: NEGATIVE
Spec Grav, UA: 1.01 (ref 1.010–1.025)
Urobilinogen, UA: 0.2 E.U./dL
pH, UA: 6.5 (ref 5.0–8.0)

## 2018-06-18 LAB — POCT RAPID STREP A (OFFICE): Rapid Strep A Screen: NEGATIVE

## 2018-06-18 NOTE — Progress Notes (Signed)
   Subjective:    Patient ID: Bethany Boyd, female    DOB: 16-Jun-1996, 22 y.o.   MRN: [id number]  HPI Pt was here August 6 th for routine health maintenance. Gave hx of recent pharyngitis treated with antibiotics. Noted to have mild elevation of LFTs. Was advised to have them repeated in a week or so. Thought might be viral related. Saw Dr. Everlene Farrier August 8th at Urgent Care.She had called here complaining of fever spike of 102.5 degrees. Due to elevated LFTs and fatigue, we both suspected Mononucleosis or CMV infection. However, labs done by Dr. Everlene Farrier do not indicate acute mono or CMV infection.  Has had persistent malaise and fatigue.  Says she is feeling much better and is here for follow up. Took some Tylenol and Ibuprofen when she had fever last week but not a lot. Has been out of work.  No longer on menses. Has small exudate left tonsil. Says throat is scratchy.  No one else at home is ill. Has boyfriend of 3 months. Denies any drug use. No hx needle sticks.  Today, urine was checked and was dark purple red color and tested positive for protein and urobilinogen. Says this is the first time she has seen this. No longer on menses.  Hepatitis titers drawn as well as LFTs.  Considered treating her for exudative pharyngitis with antibiotics but decided to hold off once urine abnormalities detected.  Has no nausea and vomiting. No icterus or itching.  Dr. Everlene Farrier performed Hepatitis panel negative for Hep A IgM, Hepatitis B core IgM, Hepatitis C  Today bilirubin is now 5.2 and was 1.2 when tested 6 days ago. Alk phos increased from 68 to 121. SGPT increased from 100 to 175. Concern for obstructive pattern with elevated bili. Has no N,V, D. Feels better than over the weekend when she was sleeping a lot according to her mother. BUN and Creatinine are normal.  Review of Systemssee above     Objective:   Physical Exam  Abdomen obese soft nondistended with out masses or tenderness. No bleeding  from vaginal vault. Chest clear. White exudate left tonsil. TMS clear. Neck supple. No significant scleral icterus.      Assessment & Plan:  Elevated LFTs worsening past 6 days with significantly elevated bilirubin. Need to rule out mass/ obstruction. No N,V,D. Spoke with Mother who works at [employer]. She p[refers to take her there for evaluation.

## 2018-06-18 NOTE — Patient Instructions (Signed)
Proceed to [facility] for evaluation

## 2018-06-18 NOTE — ED Triage Notes (Signed)
Pt reports that she was told to come her from PCP office as she has rising liver enzymes> Pt denies hx of Kidney d/o and states that she was given amoxicillin ands steroids on 05/27/18 as txmt of  was being treated for strep pt reports that she has been being treated for swollen glands for almost 3 weeks.  Pt denies pain and discomfort at this time.

## 2018-06-18 NOTE — Discharge Instructions (Addendum)
The testing tonight does not indicate a clear diagnosis.  There is no sign of obstruction that would require immediate surgery.  Your spleen was in the ultrasound.  The gastroenterologist can help you with further diagnosis and treatment options, and hopefully improve your status.  Make sure you go to the appointment at 10 AM tomorrow at Dr. Kenna GilbertMann's office.

## 2018-06-18 NOTE — ED Notes (Addendum)
Ultrasound at bedside

## 2018-06-18 NOTE — ED Notes (Addendum)
Pt and family verbalized that they have been in the room for an hour and have only seen a tech. This nurse apologized and explained that I am the oncoming nurse who will start her care, but we are waiting for a provider to see the patient.  Family states she works here and understands.

## 2018-06-18 NOTE — ED Provider Notes (Signed)
[name] Provider Note   CSN: [id number] Arrival date & time: 06/18/18  1637     History   Chief Complaint Chief Complaint  Patient presents with  . abnormal Liver enzymes    HPI Bethany Boyd is a 22 y.o. female.  HPI   Patient presents for evaluation of rising transaminases and bilirubin, with concern for "obstruction," after seeing her PCP, today.  She has had comprehensive testing, which has been repeated by the PCP today.  Patient is doing fairly well.  In the last 3 weeks she has had pharyngitis, malaise, occasional fever, anorexia, but no significant pain.  She was treated with both antibiotics and steroids for 10 days without improvement.  No prior similar symptoms.  Today she ate pretty well and does not feel anorexic.  No documented fever today.  No prior intra-abdominal processes.  No known sick contacts.  There are no other known modifying factors.  Past Medical History:  Diagnosis Date  . PCOS (polycystic ovarian syndrome)     Patient Active Problem List   Diagnosis Date Noted  . Acne 05/03/2013  . Obesity, unspecified 05/03/2013  . Polycystic ovary syndrome 01/11/2012    Past Surgical History:  Procedure Laterality Date  . MOUTH SURGERY     wisdom teeth     OB History    Gravida  0   Para      Term      Preterm      AB      Living        SAB      TAB      Ectopic      Multiple      Live Births               Home Medications    Prior to Admission medications   Medication Sig Start Date End Date Taking? Authorizing Provider  etonogestrel-ethinyl estradiol (NUVARING) 0.12-0.015 MG/24HR vaginal ring Insert vaginally and leave in place for 3 consecutive weeks, then remove for 1 week. 06/12/18  Yes Baxley, Luanna ColeMary J, MD  valACYclovir (VALTREX) 500 MG tablet Take 1 tablet (500 mg total) by mouth 2 (two) times daily as needed. Patient taking differently: Take 500 mg by mouth 2 (two) times daily as  needed (cold sores).  05/16/13  Yes Baxley, Luanna ColeMary J, MD  clindamycin (CLEOCIN-T) 1 % lotion Apply topically nightly as directed. Patient not taking: Reported on 06/18/2018 06/12/18   Margaree MackintoshBaxley, Mary J, MD  Vitamin D, Ergocalciferol, 2000 units CAPS Take 2,000 Units by mouth daily.    [provider]    Family History Family History  Problem Relation Age of Onset  . Asthma Mother   . Diabetes Mother   . Hypertension Father   . Asthma Sister   . Stroke Paternal Grandfather 1839  . Breast cancer Paternal Grandmother 2760  . Diabetes Maternal Grandmother   . Diabetes Maternal Grandfather     Social History Social History   Tobacco Use  . Smoking status: Never Smoker  . Smokeless tobacco: Never Used  Substance Use Topics  . Alcohol use: No  . Drug use: No     Allergies   Lorabid [loracarbef]   Review of Systems Review of Systems  All other systems reviewed and are negative.    Physical Exam Updated Vital Signs BP 122/78   Pulse (!) 116   Temp 99.5 F (37.5 C) (Oral)   Resp 16   LMP 06/12/2018 (Exact  Date) Comment: PATIENT IS CURRENTLY MENSTRUATING   SpO2 96%   Physical Exam  Constitutional: She is oriented to person, place, and time. She appears well-developed.  Obese  HENT:  Head: Normocephalic and atraumatic.  Eyes: Pupils are equal, round, and reactive to light. Conjunctivae and EOM are normal.  Neck: Normal range of motion and phonation normal. Neck supple.  Cardiovascular: Normal rate and regular rhythm.  Pulmonary/Chest: Effort normal and breath sounds normal. She exhibits no tenderness.  Abdominal: Soft. She exhibits no distension and no mass. There is no tenderness. There is no guarding.  Musculoskeletal: Normal range of motion.  Neurological: She is alert and oriented to person, place, and time. She exhibits normal muscle tone.  Skin: Skin is warm and dry.  Psychiatric: She has a normal mood and affect. Her behavior is normal. Judgment and thought  content normal.  Nursing note and vitals reviewed.    ED Treatments / Results  Labs (all labs ordered are listed, but only abnormal results are displayed) Labs Reviewed - No data to display  EKG None  Radiology US Abdomen Complete  Result Date: 06/18/2018 CLINICAL DATA:  Elevated liver function studies. Elevated bilirubin. Jaundice. EXAM: ABDOMEN ULTRASOUND COMPLETE COMPARISON:  None. FINDINGS: Gallbladder: No gallstones or wall thickening visualized. No sonographic Murphy sign noted by sonographer. Common bile duct: Diameter: 3.8 mm, normal Liver: No focal lesion identified. Within normal limits in parenchymal echogenicity. Portal vein is patent on color Doppler imaging with normal direction of blood flow towards the liver. IVC: No abnormality visualized. Pancreas: Limited visualization due to overlying bowel gas. Spleen: Spleen is enlarged with length measurement of 15.1 cm. Volume is calculated at 1023 mL. Right Kidney: Length: 14 cm. Echogenicity within normal limits. No mass or hydronephrosis visualized. Left Kidney: Length: 14 cm. Echogenicity within normal limits. No mass or hydronephrosis visualized. Abdominal aorta: No aneurysm visualized. Other findings: Examination is technically limited due to body habitus of the patient. IMPRESSION: 1. Normal ultrasound appearance of the liver, gallbladder, and bile ducts. 2. Diffuse splenic enlargement. Electronically Signed   By: Burman Nieves M.D.   On: 06/18/2018 22:38    Procedures Procedures (including critical care time)  Medications Ordered in ED Medications - No data to display   Initial Impression / Assessment and Plan / ED Course  I have reviewed the triage vital signs and the nursing notes.  Pertinent labs & imaging results that were available during my care of the patient were reviewed by me and considered in my medical decision making (see chart for details).  Clinical Course as of Jun 18 2326  Mon Jun 18, 2018  2309  Case discussed with gastroenterology, Dr. Loreta Ave.  Case details discussed in depth.  No indication for intervention at this time.  Dr. Loreta Ave will see her in the office at 10:00 tomorrow morning for further evaluation and treatment.   [EW]    Clinical Course User Index [EW] Mancel Bale, MD     Patient Vitals for the past 24 hrs:  BP Temp Temp src Pulse Resp SpO2  06/18/18 2230 122/78 - - (!) 116 16 96 %  06/18/18 2200 - - - - 15 -  06/18/18 2130 107/81 - - (!) 108 - 99 %  06/18/18 2100 107/61 - - (!) 107 17 98 %  06/18/18 2000 102/78 - - (!) 116 18 99 %  06/18/18 1930 128/84 - - (!) 106 17 100 %  06/18/18 1902 133/87 - - (!) 105 18 98 %  06/18/18 1900 133/87 - - (!) 105 - 98 %  06/18/18 1830 123/86 - - (!) 105 - 98 %  06/18/18 1717 127/90 99.5 F (37.5 C) Oral (!) 117 18 99 %    10:55 PM Reevaluation with update and discussion. After initial assessment and treatment, an updated evaluation reveals no change in clinical status.  Patient was calm and interactive until I explained the findings, which were not diagnostic.  She then became angry, and stated she was frustrated because she cannot keeps getting shifted around between doctors with no answers.  I explained that I would be talking to a gastroenterologist who could give us some insight and hopefully arrange some follow-up care.  All questions were answered. Mancel BaleElliott Kenyon Eshleman   Medical Decision Making: Nonspecific elevated bilirubin and transaminases.  Initial screening by PCP, and urgent care physician, is not diagnostic.  Patient has history of remote Epstein-Barr virus infection.  No evidence for acute CMV or hepatitis viral infection on recent screening.  CBC done today shows lymphocytosis.  A few atypical cells seen.  CRITICAL CARE-no Performed by: Mancel BaleElliott Albertine Lafoy   Nursing Notes Reviewed/ Care Coordinated Applicable Imaging Reviewed Interpretation of Laboratory Data incorporated into ED treatment  The patient appears reasonably  screened and/or stabilized for discharge and I doubt any other medical condition or other [facility] requiring further screening, evaluation, or treatment in the ED at this time prior to discharge.  Plan: Home Medications-avoid Tylenol, use ibuprofen as needed for pain; Home Treatments-usual diet; return here if the recommended treatment, does not improve the symptoms; Recommended follow up-GI follow-up tomorrow as planned     Final Clinical Impressions(s) / ED Diagnoses   Final diagnoses:  Hyperbilirubinemia  Transaminitis    ED Discharge Orders    None       Mancel BaleWentz, Dominica Kent, MD 06/18/18 2327

## 2018-06-19 DIAGNOSIS — R194 Change in bowel habit: Secondary | ICD-10-CM | POA: Diagnosis not present

## 2018-06-19 DIAGNOSIS — R161 Splenomegaly, not elsewhere classified: Secondary | ICD-10-CM | POA: Diagnosis not present

## 2018-06-19 DIAGNOSIS — R749 Abnormal serum enzyme level, unspecified: Secondary | ICD-10-CM | POA: Diagnosis not present

## 2018-06-19 LAB — HEPATITIS A ANTIBODY, TOTAL: HEPATITIS A AB,TOTAL: REACTIVE — AB

## 2018-06-19 LAB — HEPATITIS B CORE ANTIBODY, TOTAL: Hep B Core Total Ab: NONREACTIVE

## 2018-06-19 LAB — CK, TOTAL(REFL): Total CK: 49 U/L (ref 29–143)

## 2018-06-19 LAB — MYOGLOBIN, URINE

## 2018-06-19 LAB — HEPATITIS B SURFACE ANTIBODY, QUANTITATIVE: Hepatitis B-Post: <5 m[IU]/mL — ABNORMAL LOW (ref >=10)

## 2018-06-19 LAB — HEPATITIS B SURFACE ANTIGEN: Hepatitis B Surface Ag: NONREACTIVE

## 2018-06-19 LAB — HEPATITIS A ANTIBODY, IGM: HEP A IGM: NONREACTIVE

## 2018-06-19 NOTE — ED Notes (Addendum)
Pt requesting something to drink, tech explained we need to get results from ultrasound to be sure.

## 2018-06-19 NOTE — ED Notes (Addendum)
Pt called out for something to drink, tech gave ice chips while waiting for provider

## 2018-06-19 NOTE — ED Notes (Signed)
While discharging pt, she voiced that she was upset that she waited here for so long with no answers, that she could have eaten and had something to drink this whole time. This nurse explained that she understands it is frustrating but we keep pts NPO until results are done for their safety. 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 This nurse apologized for their wait time, pt interrupted and said "mom you work here you have the phone numbers you need." and exited the room.  Unable to get a discharge signature.

## 2018-06-20 LAB — URINE CULTURE
MICRO NUMBER:: 90952789
SPECIMEN QUALITY: ADEQUATE

## 2018-06-20 LAB — CULTURE, GROUP A STREP
MICRO NUMBER: 90952788
SPECIMEN QUALITY: ADEQUATE

## 2018-06-20 LAB — URINALYSIS, MICROSCOPIC ONLY

## 2018-06-20 LAB — EXTRA URINE SPECIMEN

## 2018-06-21 ENCOUNTER — Encounter: Payer: Self-pay | Admitting: Internal Medicine

## 2018-06-21 DIAGNOSIS — R748 Abnormal levels of other serum enzymes: Secondary | ICD-10-CM | POA: Diagnosis not present

## 2018-06-22 ENCOUNTER — Other Ambulatory Visit: Payer: Self-pay | Admitting: Internal Medicine

## 2018-06-22 DIAGNOSIS — R748 Abnormal levels of other serum enzymes: Secondary | ICD-10-CM

## 2018-06-25 ENCOUNTER — Other Ambulatory Visit: Payer: 59 | Admitting: Internal Medicine

## 2018-07-02 ENCOUNTER — Encounter: Payer: Self-pay | Admitting: Internal Medicine

## 2018-09-03 MED FILL — NUVARING VAGINAL RING: 0.12-0.015 | 84 days supply | Qty: 3 | Fill #1

## 2018-10-01 DIAGNOSIS — R161 Splenomegaly, not elsewhere classified: Secondary | ICD-10-CM | POA: Diagnosis not present

## 2018-10-01 DIAGNOSIS — D72829 Elevated white blood cell count, unspecified: Secondary | ICD-10-CM | POA: Diagnosis not present

## 2018-10-01 DIAGNOSIS — J351 Hypertrophy of tonsils: Secondary | ICD-10-CM | POA: Diagnosis not present

## 2018-10-01 DIAGNOSIS — R945 Abnormal results of liver function studies: Secondary | ICD-10-CM | POA: Diagnosis not present

## 2018-10-05 DIAGNOSIS — R161 Splenomegaly, not elsewhere classified: Secondary | ICD-10-CM | POA: Diagnosis not present

## 2018-11-29 MED FILL — ETONOGESTREL-ETHINYL ESTRAD: 0.12-0.015 | 84 days supply | Qty: 3 | Fill #2

## 2018-12-03 ENCOUNTER — Other Ambulatory Visit: Payer: Self-pay

## 2019-01-01 DIAGNOSIS — Z3044 Encounter for surveillance of vaginal ring hormonal contraceptive device: Secondary | ICD-10-CM | POA: Diagnosis not present

## 2019-01-01 DIAGNOSIS — Z23 Encounter for immunization: Secondary | ICD-10-CM | POA: Diagnosis not present

## 2019-03-05 MED FILL — ETONOGESTREL-ETHINYL ESTRAD: 0.12-0.015 | 84 days supply | Qty: 3 | Fill #3

## 2020-02-04 IMAGING — DX DG FOOT COMPLETE 3+V*L*
3 series · 3 of 3 positions shown · non-contrast
Comparison: None.

CLINICAL DATA: Pt c/o left lateral foot pain x 1 week. Denies
injury.

EXAM:
LEFT FOOT - COMPLETE 3+ VIEW

[foot ap]
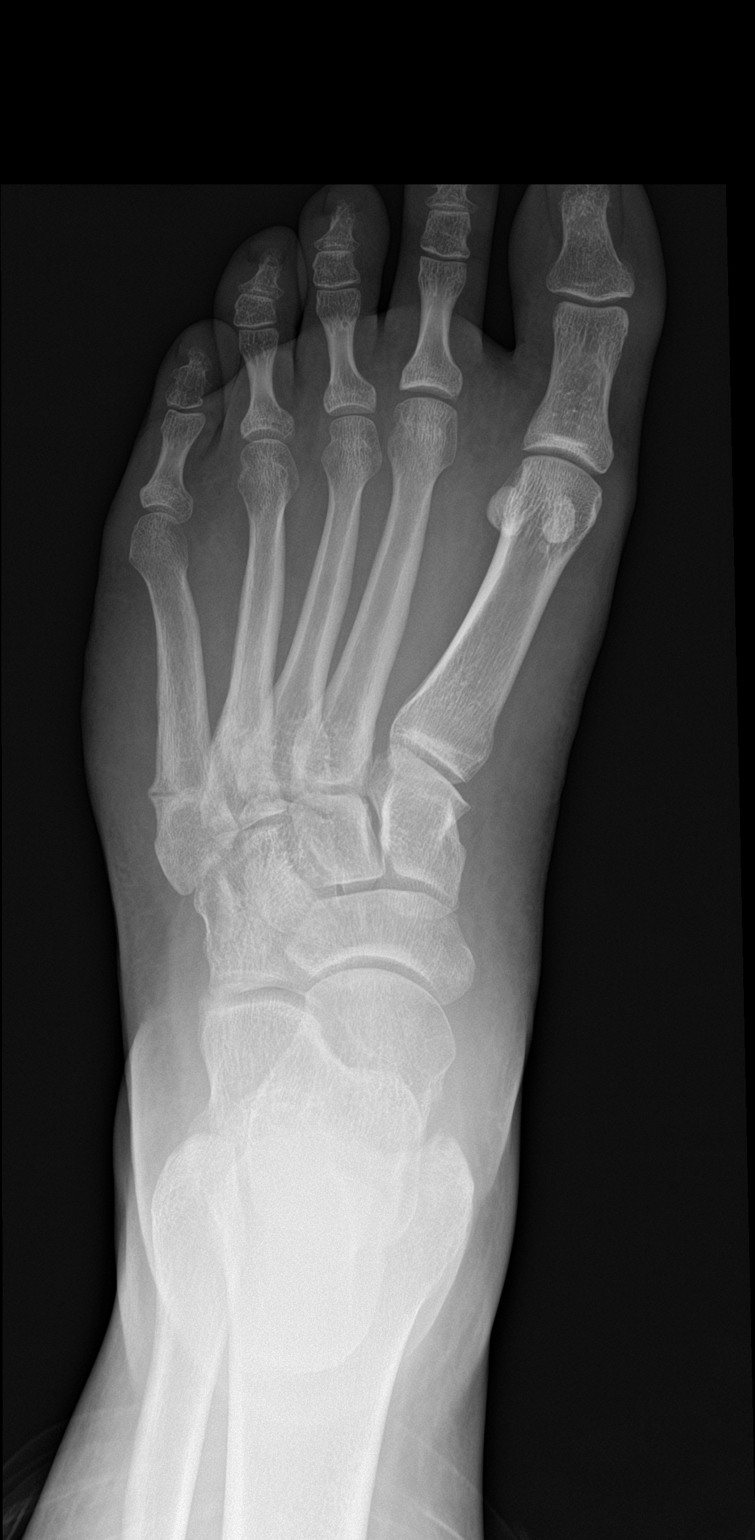

[foot obl]
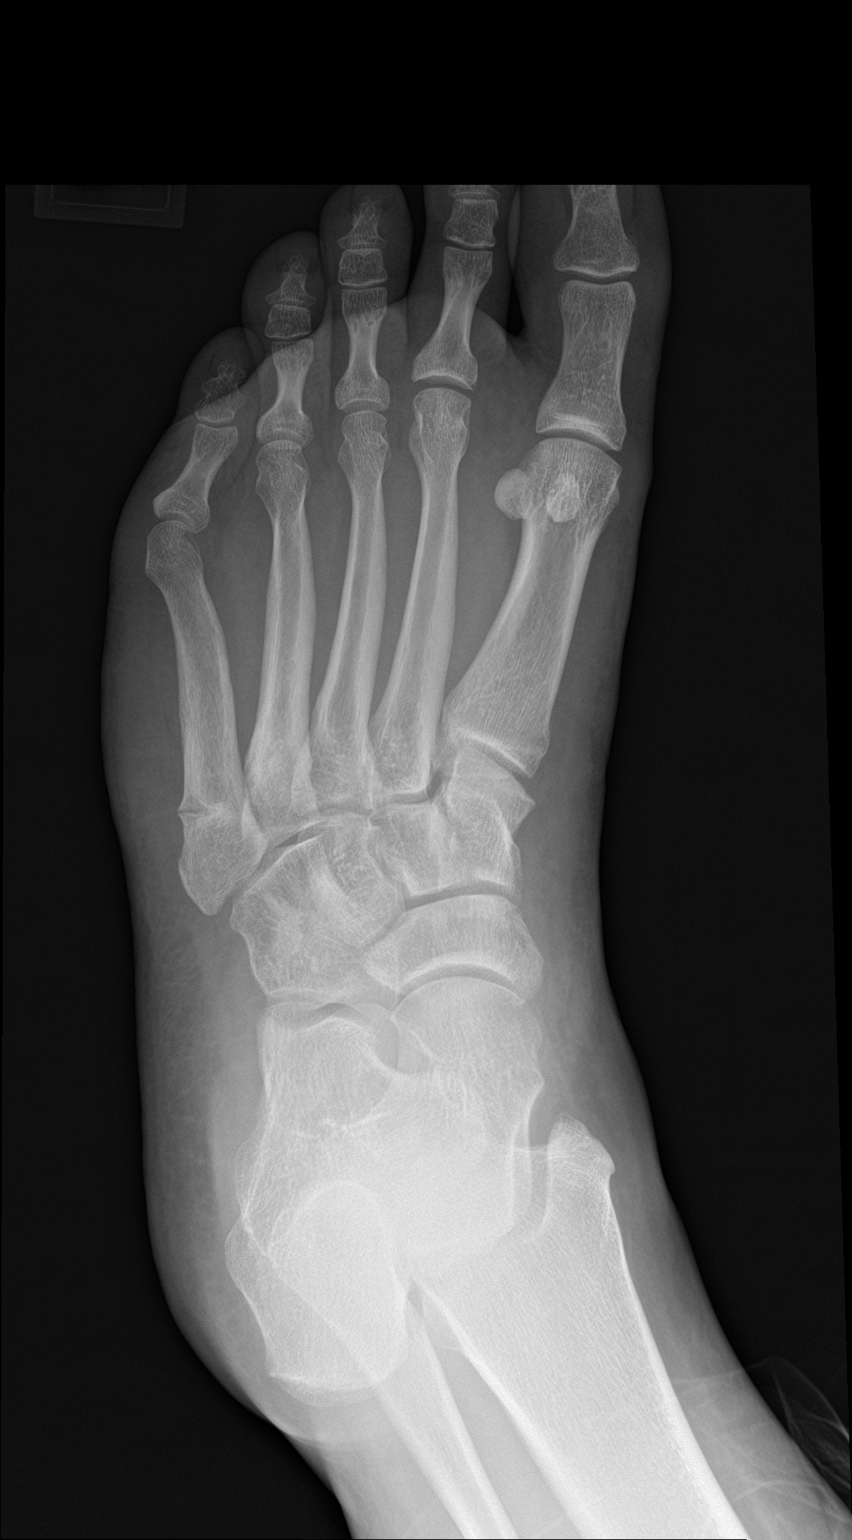

[foot lat]
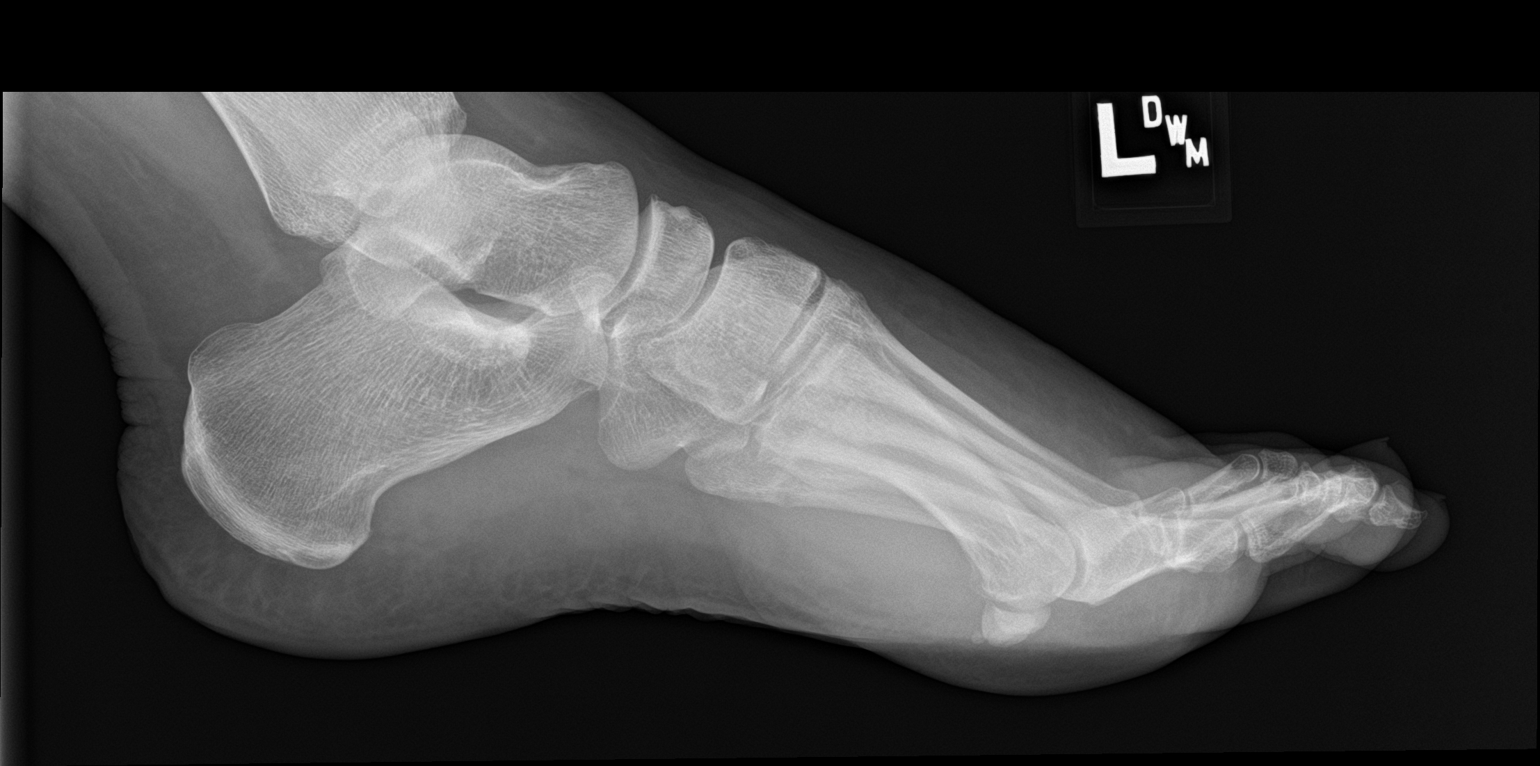

[3 of 3 positions shown; findings below may reference images not displayed]

FINDINGS: There is an incomplete transverse fracture across the metaphysis at
the base of the fifth metatarsal. This has a subacute to chronic
appearance, most likely an incompletely healed fracture.

No other fractures.  No bone lesions.

The joints are normally spaced and aligned.

Mild soft tissue swelling is evident lateral to the fractured fifth
metatarsal.
IMPRESSION: 1. Incomplete fracture of the metaphysis of the proximal fifth
metatarsal. This is most likely an incompletely healed subacute to
chronic metaphysis fracture.
2. No other fractures.  No joint abnormalities.

## 2020-03-06 ENCOUNTER — Other Ambulatory Visit: Payer: Self-pay | Admitting: Obstetrics & Gynecology
